# Patient Record
Sex: Male | Born: 1970 | Race: Black or African American | Hispanic: No | Marital: Single | Smoking: Never smoker
Health system: Southern US, Community
[De-identification: ages and names within clinical notes are randomized; demographics above are authoritative.]

## PROBLEM LIST (undated history)

## (undated) DIAGNOSIS — I10 Essential (primary) hypertension: Secondary | ICD-10-CM

## (undated) DIAGNOSIS — E119 Type 2 diabetes mellitus without complications: Secondary | ICD-10-CM

## (undated) HISTORY — PX: CHOLECYSTECTOMY: SHX55

## (undated) HISTORY — PX: HERNIA REPAIR: SHX51

---

## 2005-02-18 ENCOUNTER — Emergency Department: Payer: Self-pay

## 2007-07-07 ENCOUNTER — Encounter: Payer: Self-pay | Admitting: Orthopedic Surgery

## 2007-08-03 ENCOUNTER — Emergency Department (HOSPITAL_COMMUNITY): Admission: EM | Admit: 2007-08-03 | Discharge: 2007-08-04 | Payer: Self-pay | Admitting: Emergency Medicine

## 2007-08-05 ENCOUNTER — Ambulatory Visit: Payer: Self-pay | Admitting: Orthopedic Surgery

## 2007-08-05 DIAGNOSIS — IMO0002 Reserved for concepts with insufficient information to code with codable children: Secondary | ICD-10-CM

## 2007-08-06 ENCOUNTER — Encounter: Payer: Self-pay | Admitting: Orthopedic Surgery

## 2007-09-02 ENCOUNTER — Telehealth: Payer: Self-pay | Admitting: Orthopedic Surgery

## 2007-10-08 ENCOUNTER — Observation Stay (HOSPITAL_COMMUNITY): Admission: EM | Admit: 2007-10-08 | Discharge: 2007-10-10 | Payer: Self-pay | Admitting: Emergency Medicine

## 2007-10-08 ENCOUNTER — Ambulatory Visit: Payer: Self-pay | Admitting: Cardiology

## 2007-10-09 ENCOUNTER — Encounter: Payer: Self-pay | Admitting: Cardiology

## 2007-10-09 ENCOUNTER — Encounter (INDEPENDENT_AMBULATORY_CARE_PROVIDER_SITE_OTHER): Payer: Self-pay | Admitting: Family Medicine

## 2008-04-25 ENCOUNTER — Emergency Department (HOSPITAL_COMMUNITY): Admission: EM | Admit: 2008-04-25 | Discharge: 2008-04-25 | Payer: Self-pay | Admitting: Emergency Medicine

## 2009-04-23 ENCOUNTER — Emergency Department (HOSPITAL_COMMUNITY): Admission: EM | Admit: 2009-04-23 | Discharge: 2009-04-23 | Payer: Self-pay | Admitting: Emergency Medicine

## 2009-08-03 IMAGING — CR DG HAND COMPLETE 3+V*L*
3 series · 3 of 3 positions shown · non-contrast
Comparison: None.

CLINICAL DATA: Hand injury.  Pain in the fourth and fifth fingers.

LEFT HAND - COMPLETE 3+ VIEW

[view not recorded (1 of 3)]
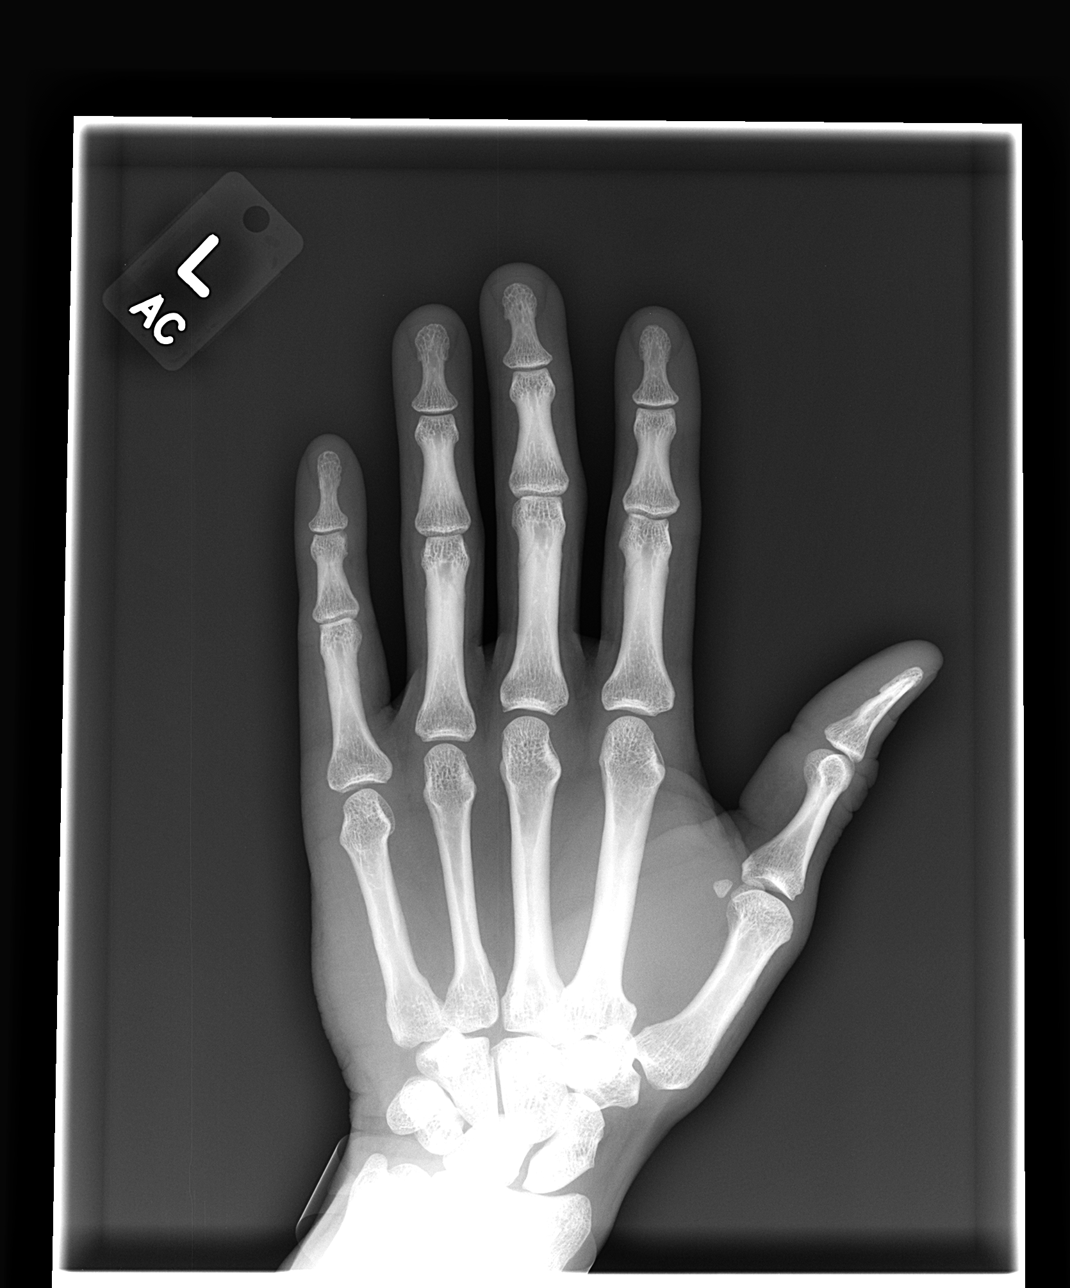

[view not recorded (2 of 3)]
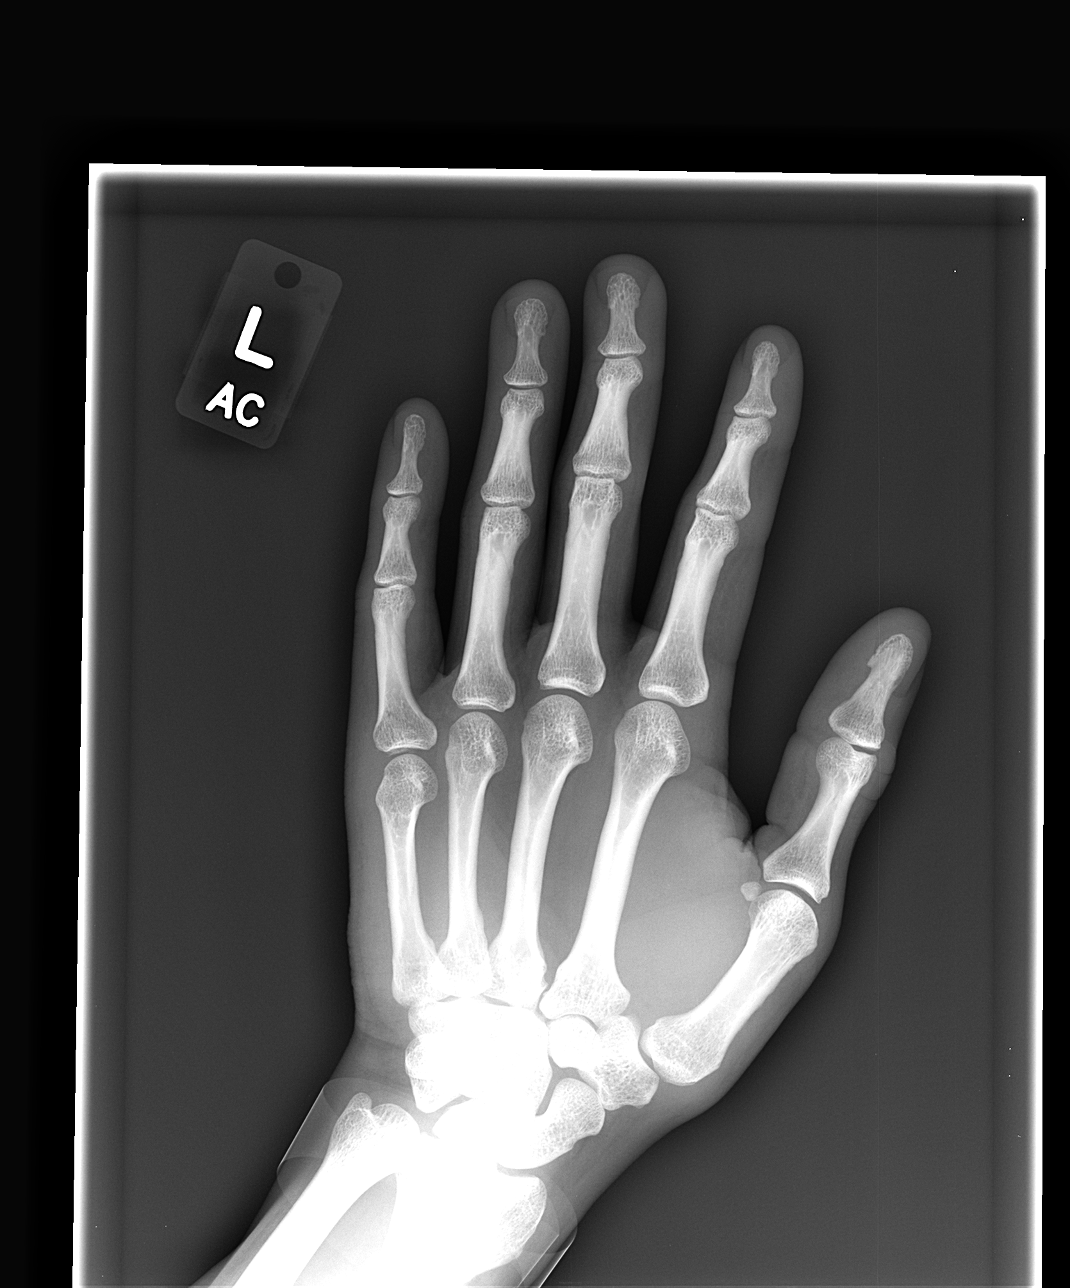

[view not recorded (3 of 3)]
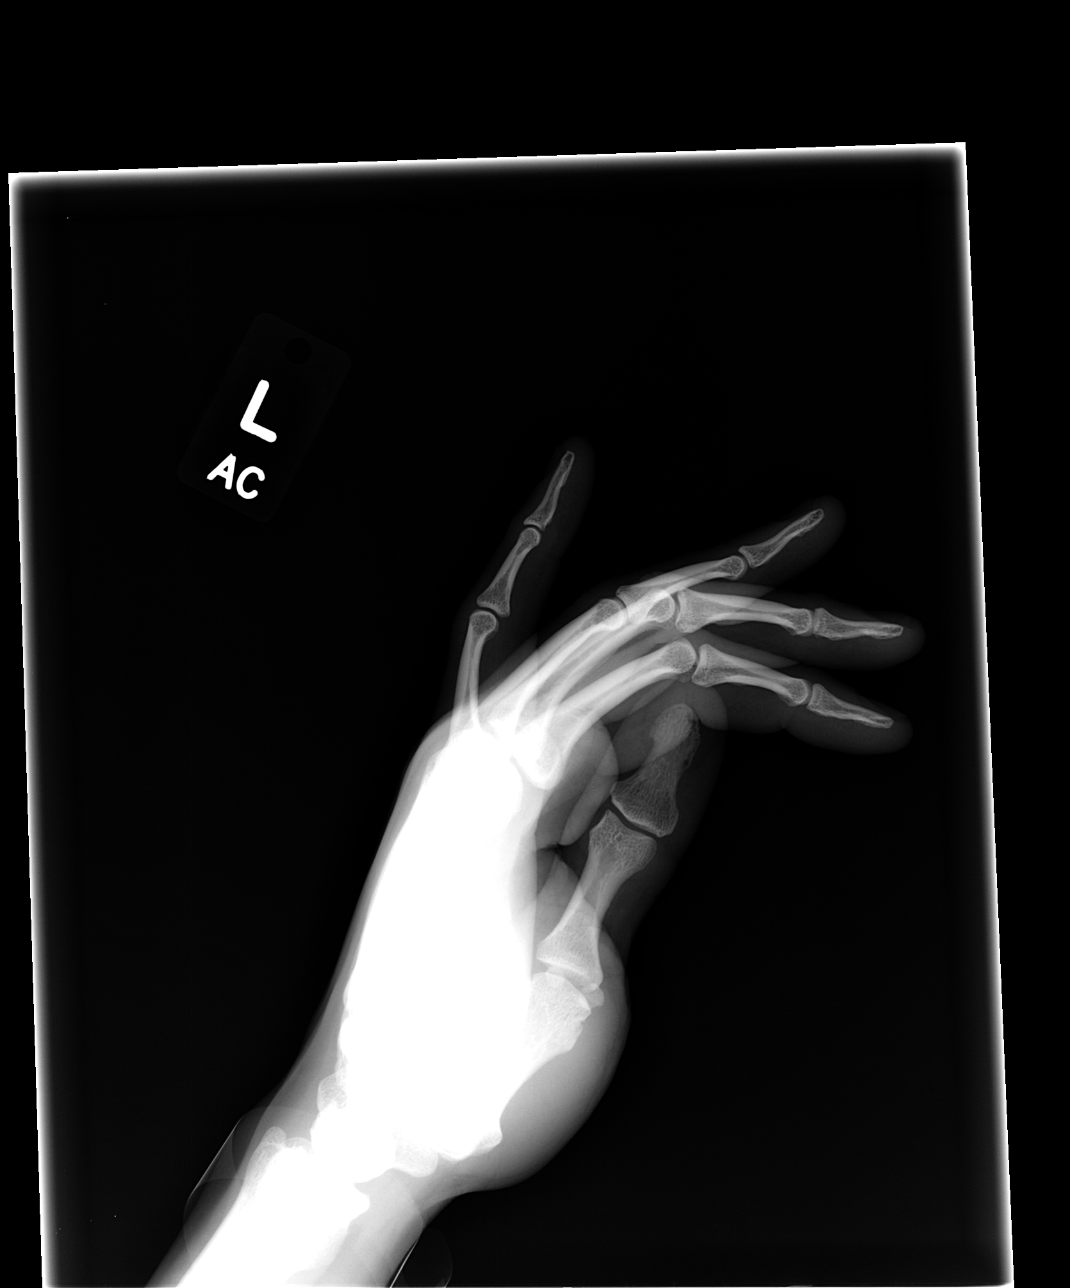

[3 of 3 positions shown; findings below may reference images not displayed]

FINDINGS: There is slight irregularity of the volar base of the
fifth proximal phalanx with an associated oblique lucency on the [facility]
oblique views.  This area is not well visualized on the lateral
view, but is suspicious for an intra-articular fracture.  There is
no dislocation.  No other fractures or foreign bodies are seen.
IMPRESSION: Suspicion of nondisplaced intra-articular fracture of the base of
the fifth proximal phalanx as described.

## 2009-10-28 ENCOUNTER — Emergency Department (HOSPITAL_COMMUNITY): Admission: EM | Admit: 2009-10-28 | Discharge: 2009-10-28 | Payer: Self-pay | Admitting: Emergency Medicine

## 2010-04-18 ENCOUNTER — Emergency Department (HOSPITAL_COMMUNITY)
Admission: EM | Admit: 2010-04-18 | Discharge: 2010-04-19 | Disposition: A | Discharge status: Home / Self Care | Payer: Medicaid Other | Attending: Emergency Medicine | Admitting: Emergency Medicine

## 2010-04-18 DIAGNOSIS — J020 Streptococcal pharyngitis: Secondary | ICD-10-CM | POA: Insufficient documentation

## 2010-06-20 LAB — POCT I-STAT, CHEM 8
BUN: 12 mg/dL (ref 6–23)
Calcium, Ion: 1.21 mmol/L (ref 1.12–1.32)
Creatinine, Ser: 1 mg/dL (ref 0.4–1.5)
Glucose, Bld: 371 mg/dL — ABNORMAL HIGH (ref 70–99)
Hemoglobin: 18.7 g/dL — ABNORMAL HIGH (ref 13.0–17.0)
TCO2: 28 mmol/L (ref 0–100)

## 2010-06-20 LAB — POCT CARDIAC MARKERS: CKMB, poc: <1 ng/mL — ABNORMAL LOW (ref 1.0–8.0)
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
## 2010-07-18 NOTE — Consult Note (Signed)
NAME:  Jordan Osborn, Jordan Osborn                    ACCOUNT NO.:  [id number]   MEDICAL RECORD NO.:  [id number]          PATIENT TYPE:  INP   LOCATION:  A336                          FACILITY:  [facility]   PHYSICIAN:  Thomas C. Wall, MD, FACCDATE OF BIRTH:  Feb 07, 1971   DATE OF CONSULTATION:  10/09/2007  DATE OF DISCHARGE:                                 CONSULTATION   I was asked by Dr. Elige Radon of the [facility] to consult on Jordan  Osborn with chest pain.   HISTORY OF PRESENT ILLNESS:  He is a 40 year old African-American male  who was at [facility] yesterday complaining of chronic  headaches.  While he was there, he began to have some aching in his left  arm and then he had some sharp pain in his left side of his chest.  It  went to his right side.   He had no nausea, vomiting, or diaphoresis.   He does note some sweats at night.  He has had no fever that he is aware  of.  He has had no chills.   He denies any recent viral illness such as URI.   His EKG is normal except for perhaps some mild early repolarization.  His point of care markers enzymes were negative thus far.  Chest x-ray  showed no acute cardiopulmonary disease.   He had a head CT for his headaches, which is negative.   PAST MEDICAL HISTORY:   ALLERGIES:  There is no known drug allergies.   He was on no medications prior to admission.   He has had no previous surgery.   SOCIAL HISTORY:  He currently lives with his girlfriend in [place].  He is a nonsmoker and nondrinker.   His risk factors for coronary artery disease are really negative.  He  does not know his lipid panel.   REVIEW OF SYSTEMS:  As in the HPI is really negative.  All points of  care reviewed.  Also refer to Dr. Claudean Severance evaluation.   EXAM:  GENERAL:  He is in no acute distress.  He is a pleasant  gentleman.  SKIN:  Warm and dry.  VITAL SIGNS:  His blood pressure is 129/77, his pulse is 80 and regular,  his respiratory rate is 20,  temperature is 98.6, and room air sats 93%.  HEENT:  Somewhat of a male balding pattern.  PERRLA.  Extraocular  movement is intact.  Sclerae clear.  Facial symmetry is normal.  Dentition is satisfactory.  Carotid upstrokes were equal bilaterally without bruits.  There is no  obvious lymphadenopathy.  There is no thyromegaly.  Trachea is midline.  LUNGS:  Clear to auscultation.  No rub.  HEART:  Reveals a soft S1 and S2.  No rub, no gallop, no murmur.  PMI is  hard to appreciate.  He is fairly muscular.  ABDOMINAL EXAM:  Soft.  Good bowel sounds.  No tenderness.  No midline  bruits.  No obvious organomegaly.  EXTREMITIES:  Without cyanosis, clubbing, or edema.  Pulses are intact.  NEURO EXAM:  Intact.   ASSESSMENT:  Atypical noncoronary chest pain, rule out possible  pericarditis by history only.  His exam and EKG do not support that  diagnosis.   RECOMMENDATIONS:  Check echo.  If negative or no pericardial effusion,  reassurance only.   Thank you for the consultation.      Thomas C. Daleen Squibb, MD, [facility]  Electronically Signed     TCW/MEDQ  D:  10/09/2007  T:  10/09/2007  Job:  [id number]

## 2010-12-01 LAB — DIFFERENTIAL
Eosinophils Absolute: 0
Lymphocytes Relative: 14
Lymphocytes Relative: 25
Lymphs Abs: 1.4
Lymphs Abs: 1.9
Monocytes Absolute: 0.7
Neutro Abs: 8.2 — ABNORMAL HIGH
Neutrophils Relative %: 62
Neutrophils Relative %: 78 — ABNORMAL HIGH

## 2010-12-01 LAB — PROTIME-INR
INR: 1
Prothrombin Time: 13.4

## 2010-12-01 LAB — BASIC METABOLIC PANEL
BUN: 12
BUN: 16
CO2: 28
Chloride: 102
Creatinine, Ser: 1.06
Creatinine, Ser: 1.22
GFR calc Af Amer: >60
GFR calc non Af Amer: >60
GFR calc non Af Amer: >60
Glucose, Bld: 196 — ABNORMAL HIGH
Potassium: 4.3
Sodium: 141
Sodium: 141

## 2010-12-01 LAB — CBC
HCT: 43.5
HCT: 48
Hemoglobin: 14.7
MCV: 88.5
Platelets: 225
Platelets: 252
Platelets: 262
RDW: 13.5
WBC: 10.6 — ABNORMAL HIGH
WBC: 7.7

## 2010-12-01 LAB — CARDIAC PANEL(CRET KIN+CKTOT+MB+TROPI)
CK, MB: 0.9
Relative Index: 0.7
Relative Index: 0.8
Relative Index: 0.8
Troponin I: 0.01
Troponin I: 0.01

## 2010-12-01 LAB — POCT CARDIAC MARKERS
CKMB, poc: 1.7
Myoglobin, poc: 108
Troponin i, poc: <0.05

## 2010-12-01 LAB — LIPID PANEL
Cholesterol: 179
HDL: 31 — ABNORMAL LOW
Total CHOL/HDL Ratio: 5.8
Triglycerides: 108

## 2011-12-09 ENCOUNTER — Emergency Department (HOSPITAL_COMMUNITY): Payer: Medicare Other

## 2011-12-09 ENCOUNTER — Observation Stay (HOSPITAL_COMMUNITY)
Admission: EM | Admit: 2011-12-09 | Discharge: 2011-12-11 | Disposition: A | Discharge status: Home / Self Care | Payer: Medicare Other | Attending: General Surgery | Admitting: General Surgery

## 2011-12-09 ENCOUNTER — Encounter (HOSPITAL_COMMUNITY): Payer: Self-pay | Admitting: *Deleted

## 2011-12-09 DIAGNOSIS — R112 Nausea with vomiting, unspecified: Secondary | ICD-10-CM | POA: Insufficient documentation

## 2011-12-09 DIAGNOSIS — R109 Unspecified abdominal pain: Secondary | ICD-10-CM | POA: Insufficient documentation

## 2011-12-09 DIAGNOSIS — K5289 Other specified noninfective gastroenteritis and colitis: Principal | ICD-10-CM | POA: Insufficient documentation

## 2011-12-09 DIAGNOSIS — R197 Diarrhea, unspecified: Secondary | ICD-10-CM | POA: Insufficient documentation

## 2011-12-09 LAB — URINALYSIS, ROUTINE W REFLEX MICROSCOPIC
Bilirubin Urine: NEGATIVE
Hgb urine dipstick: NEGATIVE
Ketones, ur: NEGATIVE mg/dL
Protein, ur: NEGATIVE mg/dL
Urobilinogen, UA: 0.2 mg/dL (ref 0.0–1.0)

## 2011-12-09 LAB — COMPREHENSIVE METABOLIC PANEL
AST: 24 U/L (ref 0–37)
Alkaline Phosphatase: 116 U/L (ref 39–117)
BUN: 11 mg/dL (ref 6–23)
CO2: 25 mEq/L (ref 19–32)
Chloride: 99 mEq/L (ref 96–112)
Creatinine, Ser: 1.13 mg/dL (ref 0.50–1.35)
GFR calc non Af Amer: 78 mL/min — ABNORMAL LOW (ref >90)
Total Bilirubin: 0.2 mg/dL — ABNORMAL LOW (ref 0.3–1.2)

## 2011-12-09 LAB — CBC
HCT: 47.3 % (ref 39.0–52.0)
MCV: 86.8 fL (ref 78.0–100.0)
Platelets: 225 10*3/uL (ref 150–400)
RBC: 5.45 MIL/uL (ref 4.22–5.81)
WBC: 12.4 10*3/uL — ABNORMAL HIGH (ref 4.0–10.5)

## 2011-12-09 LAB — LIPASE, BLOOD: Lipase: 72 U/L — ABNORMAL HIGH (ref 11–59)

## 2011-12-09 MED ORDER — ONDANSETRON HCL 4 MG/2ML IJ SOLN
4.0000 mg | Freq: Four times a day (QID) | INTRAMUSCULAR | Status: DC | PRN
  Administered 2011-12-10: 4 mg via INTRAVENOUS
  Filled 2011-12-09: qty 2

## 2011-12-09 MED ORDER — SODIUM CHLORIDE 0.9 % IV BOLUS (SEPSIS)
1000.0000 mL | Freq: Once | INTRAVENOUS | Status: AC
  Administered 2011-12-09: 1000 mL via INTRAVENOUS

## 2011-12-09 MED ORDER — PANTOPRAZOLE SODIUM 40 MG IV SOLR
40.0000 mg | Freq: Every day | INTRAVENOUS | Status: DC
  Administered 2011-12-10 (×2): 40 mg via INTRAVENOUS
  Filled 2011-12-09 (×2): qty 40

## 2011-12-09 MED ORDER — HYDROMORPHONE HCL PF 1 MG/ML IJ SOLN
1.0000 mg | INTRAMUSCULAR | Status: DC | PRN
  Administered 2011-12-10 – 2011-12-11 (×2): 1 mg via INTRAVENOUS
  Filled 2011-12-09 (×2): qty 1

## 2011-12-09 MED ORDER — METHYLPREDNISOLONE SODIUM SUCC 125 MG IJ SOLR
125.0000 mg | Freq: Once | INTRAMUSCULAR | Status: AC
  Administered 2011-12-09: 125 mg via INTRAVENOUS
  Filled 2011-12-09: qty 2

## 2011-12-09 MED ORDER — HYDROCODONE-ACETAMINOPHEN 5-325 MG PO TABS
1.0000 | ORAL_TABLET | ORAL | Status: DC | PRN
Start: 2011-12-09 — End: 2011-12-11
  Administered 2011-12-10 – 2011-12-11 (×3): 2 via ORAL
  Filled 2011-12-09 (×3): qty 2

## 2011-12-09 MED ORDER — IOHEXOL 300 MG/ML  SOLN
100.0000 mL | Freq: Once | INTRAMUSCULAR | Status: AC | PRN
  Administered 2011-12-09: 100 mL via INTRAVENOUS

## 2011-12-09 MED ORDER — ONDANSETRON HCL 4 MG/2ML IJ SOLN
4.0000 mg | Freq: Once | INTRAMUSCULAR | Status: AC
  Administered 2011-12-09: 4 mg via INTRAVENOUS
  Filled 2011-12-09: qty 2

## 2011-12-09 MED ORDER — ACETAMINOPHEN 650 MG RE SUPP: 650.0000 mg | Freq: Four times a day (QID) | RECTAL | Status: DC | PRN

## 2011-12-09 MED ORDER — FAMOTIDINE IN NACL 20-0.9 MG/50ML-% IV SOLN
20.0000 mg | Freq: Once | INTRAVENOUS | Status: AC
  Administered 2011-12-09: 20 mg via INTRAVENOUS
  Filled 2011-12-09: qty 50

## 2011-12-09 MED ORDER — ENOXAPARIN SODIUM 40 MG/0.4ML ~~LOC~~ SOLN
40.0000 mg | SUBCUTANEOUS | Status: DC
  Administered 2011-12-10 – 2011-12-11 (×2): 40 mg via SUBCUTANEOUS
  Filled 2011-12-09 (×2): qty 0.4

## 2011-12-09 MED ORDER — CIPROFLOXACIN IN D5W 400 MG/200ML IV SOLN
400.0000 mg | Freq: Two times a day (BID) | INTRAVENOUS | Status: DC
  Administered 2011-12-10 (×3): 400 mg via INTRAVENOUS
  Filled 2011-12-09 (×6): qty 200

## 2011-12-09 MED ORDER — KCL IN DEXTROSE-NACL 20-5-0.45 MEQ/L-%-% IV SOLN
INTRAVENOUS | Status: DC
  Administered 2011-12-10 (×3): via INTRAVENOUS

## 2011-12-09 MED ORDER — ACETAMINOPHEN 325 MG PO TABS: 650.0000 mg | ORAL_TABLET | Freq: Four times a day (QID) | ORAL | Status: DC | PRN

## 2011-12-09 MED ORDER — HYDROXYZINE HCL 50 MG/ML IM SOLN
25.0000 mg | Freq: Once | INTRAMUSCULAR | Status: AC
  Administered 2011-12-09: 25 mg via INTRAMUSCULAR
  Filled 2011-12-09: qty 1

## 2011-12-09 MED ORDER — MORPHINE SULFATE 4 MG/ML IJ SOLN
4.0000 mg | Freq: Once | INTRAMUSCULAR | Status: AC
  Administered 2011-12-09: 4 mg via INTRAVENOUS
  Filled 2011-12-09: qty 1

## 2011-12-09 NOTE — ED Provider Notes (Signed)
History     CSN: [id number]  Arrival date & time 12/09/11  1850   First MD Initiated Contact with Patient 12/09/11 1858      Chief Complaint  Patient presents with  . Abdominal Pain    (Consider location/radiation/quality/duration/timing/severity/associated sxs/prior treatment) Patient is a 41 y.o. male presenting with abdominal pain. The history is provided by the patient.  Abdominal Pain The primary symptoms of the illness include abdominal pain. The primary symptoms of the illness do not include fever, shortness of breath, vomiting or dysuria.  Symptoms associated with the illness do not include constipation or back pain.  pt states onset nausea, abdominal pain, w few episodes diarrhea through course of day. Diarrhea watery, no bloody or mucousy. Decreased appetite today. States did eat fish at [facility] yesterday, but others who ate not sick. States now w constant lower abdominal pain. No specific exacerbating or alleviating factors. Dull, worse w palpation. No radiation. No back or flank pain. No scrotal or testicular pain. No gu c/o. No fever or chills. Denies hx same symptoms. No known ill contacts.     History reviewed. No pertinent past medical history.  History reviewed. No pertinent past surgical history.  No family history on file.  History  Substance Use Topics  . Smoking status: Never Smoker   . Smokeless tobacco: Not on file  . Alcohol Use: Yes     occasionally      Review of Systems  Constitutional: Negative for fever.  HENT: Negative for neck pain.   Eyes: Negative for redness.  Respiratory: Negative for cough and shortness of breath.   Cardiovascular: Negative for chest pain.  Gastrointestinal: Positive for abdominal pain. Negative for vomiting and constipation.  Genitourinary: Negative for dysuria and flank pain.  Musculoskeletal: Negative for back pain.  Skin: Negative for rash.  Neurological: Negative for headaches.  Hematological: Does not  bruise/bleed easily.  Psychiatric/Behavioral: Negative for confusion.    Allergies  Benadryl  Home Medications  No current outpatient prescriptions on file.  BP 157/93  Pulse 86  Temp 98.3 F (36.8 C) (Oral)  Resp 18  Ht 5\' 11"  (1.803 m)  Wt 200 lb (90.719 kg)  BMI 27.89 kg/m2  SpO2 100%  Physical Exam  Nursing note and vitals reviewed. Constitutional: He is oriented to person, place, and time. He appears well-developed and well-nourished. No distress.  HENT:  Mouth/Throat: Oropharynx is clear and moist.  Eyes: Pupils are equal, round, and reactive to light.  Neck: Neck supple. No tracheal deviation present.  Cardiovascular: Normal rate, regular rhythm, normal heart sounds and intact distal pulses.   Pulmonary/Chest: Effort normal and breath sounds normal. No accessory muscle usage. No respiratory distress.  Abdominal: Soft. Bowel sounds are normal. He exhibits no distension and no mass. There is tenderness. There is no rebound and no guarding.       Lower abdominal tenderness, esp left.   Genitourinary:       No cva tenderness. No testicular/scrotal pain, swelling or tenderness.   Musculoskeletal: Normal range of motion. He exhibits no edema and no tenderness.  Neurological: He is alert and oriented to person, place, and time.  Skin: Skin is warm and dry.  Psychiatric: He has a normal mood and affect.    ED Course  Procedures (including critical care time)   Labs Reviewed  CBC  COMPREHENSIVE METABOLIC PANEL  URINALYSIS, ROUTINE W REFLEX MICROSCOPIC  LIPASE, BLOOD   Results for orders placed during the hospital encounter of 12/09/11  CBC      Component Value Range   WBC 12.4 (*) 4.0 - 10.5 K/uL   RBC 5.45  4.22 - 5.81 MIL/uL   Hemoglobin 16.7  13.0 - 17.0 g/dL   HCT 16.1  09.6 - 04.5 %   MCV 86.8  78.0 - 100.0 fL   MCH 30.6  26.0 - 34.0 pg   MCHC 35.3  30.0 - 36.0 g/dL   RDW 40.9  81.1 - 91.4 %   Platelets 225  150 - 400 K/uL  COMPREHENSIVE METABOLIC PANEL       Component Value Range   Sodium 135  135 - 145 mEq/L   Potassium 3.9  3.5 - 5.1 mEq/L   Chloride 99  96 - 112 mEq/L   CO2 25  19 - 32 mEq/L   Glucose, Bld 203 (*) 70 - 99 mg/dL   BUN 11  6 - 23 mg/dL   Creatinine, Ser 7.82  0.50 - 1.35 mg/dL   Calcium 95.6  8.4 - 21.3 mg/dL   Total Protein 8.2  6.0 - 8.3 g/dL   Albumin 4.4  3.5 - 5.2 g/dL   AST 24  0 - 37 U/L   ALT 23  0 - 53 U/L   Alkaline Phosphatase 116  39 - 117 U/L   Total Bilirubin 0.2 (*) 0.3 - 1.2 mg/dL   GFR calc non Af Amer 78 (*) >90 mL/min   GFR calc Af Amer >90  >90 mL/min  URINALYSIS, ROUTINE W REFLEX MICROSCOPIC      Component Value Range   Color, Urine YELLOW  YELLOW   APPearance CLEAR  CLEAR   Specific Gravity, Urine >1.030 (*) 1.005 - 1.030   pH 5.0  5.0 - 8.0   Glucose, UA NEGATIVE  NEGATIVE mg/dL   Hgb urine dipstick NEGATIVE  NEGATIVE   Bilirubin Urine NEGATIVE  NEGATIVE   Ketones, ur NEGATIVE  NEGATIVE mg/dL   Protein, ur NEGATIVE  NEGATIVE mg/dL   Urobilinogen, UA 0.2  0.0 - 1.0 mg/dL   Nitrite NEGATIVE  NEGATIVE   Leukocytes, UA NEGATIVE  NEGATIVE  LIPASE, BLOOD      Component Value Range   Lipase 72 (*) 11 - 59 U/L      MDM  Iv ns. Morphine iv. zofran iv. Labs.   Recheck persistent nausea. zofran iv.  Additional iv ns bolus.  Recheck persistent lower abd tenderness/pain (note no epigastric pain/tenderness, although lipase mildly elev).  Ct pending.   ?rxn to contrast, notes mild itching to ankles/trunk. No throat swelling. No wheezing/sob. No faintness. Pt also notes rxn to benadryl -  States itching?rash. No wheezing. Pharynx normal. ?tiny single hive/urticarial lesion to trunk. No gen rash/hives.   pepcid iv. Solumedrol iv. Vistaril.  Recheck pt improved. No rash, no sob.   Discussed labs w pt, appears glucose elev in past, pt states has been told that in past but has never taken meds for same.  Ct results noted, discussed w radiologist as no prior abd surgery, no hernias, no  apparent reason to suspect sbo, and that symptoms more c/w gastroenteritis. States ct findings could represent other process however still concern for sbo.  Recheck pt, appears more comfortable that prior. No bilious emesis, no recurrent emesis in ed, nausea improved. abd sl distended, no peritonitis on exam, no persistent/focal tenderness.   Given ct reading/findings, discussed pt with gen surgery on call, Dr Lovell Sheehan, he will see patient in ed.  Signed out to Dr Manus Gunning, ct  findings, and that surgery is coming to see in ed, anticipate admit to gen surg. If recurrent nausea/vomiting, ng.            Suzi Roots, MD 12/09/11 2206

## 2011-12-09 NOTE — ED Notes (Signed)
Patient states he does not need anything and he is feeling better at this time.

## 2011-12-09 NOTE — ED Notes (Signed)
Pt states he ate at a restaurant yesterday and has been feeling sick since.

## 2011-12-09 NOTE — ED Notes (Signed)
Patient states his stomach is starting to really burn again. RN aware.

## 2011-12-09 NOTE — H&P (Signed)
Jordan Osborn is an 41 y.o. male.   Chief Complaint: Abdominal pain, nausea, and diarrhea HPI: Patient is a 41 year old black male in his usual state of health who ate at [facility] yesterday evening. Soon after he ate fish, he began experiencing significant diarrhea. He also had some nausea. This has continued to this evening where he presented emergency room with ongoing abdominal pain, cramping, diarrhea. He did not have emesis until he drank the oral contrast for the CT scan. CT scan interestingly showed possible bowel obstruction with question pneumatosis. On further review, the radiologist stated that this finding could be seen with enteritis. The patient never has had surgery.   History reviewed. No pertinent past medical history.  History reviewed. No pertinent past surgical history.  No family history on file. Social History:  reports that he has never smoked. He does not have any smokeless tobacco history on file. He reports that he drinks alcohol. He reports that he does not use illicit drugs.  Allergies:  Allergies  Allergen Reactions  . Benadryl (Diphenhydramine Hcl)      (Not in a hospital admission)  Results for orders placed during the hospital encounter of 12/09/11 (from the past 48 hour(s))  CBC     Status: Abnormal   Collection Time   12/09/11  7:11 PM      Component Value Range Comment   WBC 12.4 (*) 4.0 - 10.5 K/uL    RBC 5.45  4.22 - 5.81 MIL/uL    Hemoglobin 16.7  13.0 - 17.0 g/dL    HCT 16.1  09.6 - 04.5 %    MCV 86.8  78.0 - 100.0 fL    MCH 30.6  26.0 - 34.0 pg    MCHC 35.3  30.0 - 36.0 g/dL    RDW 40.9  81.1 - 91.4 %    Platelets 225  150 - 400 K/uL   COMPREHENSIVE METABOLIC PANEL     Status: Abnormal   Collection Time   12/09/11  7:11 PM      Component Value Range Comment   Sodium 135  135 - 145 mEq/L    Potassium 3.9  3.5 - 5.1 mEq/L    Chloride 99  96 - 112 mEq/L    CO2 25  19 - 32 mEq/L    Glucose, Bld 203 (*) 70 - 99 mg/dL    BUN 11  6 -  23 mg/dL    Creatinine, Ser 7.82  0.50 - 1.35 mg/dL    Calcium 95.6  8.4 - 10.5 mg/dL    Total Protein 8.2  6.0 - 8.3 g/dL    Albumin 4.4  3.5 - 5.2 g/dL    AST 24  0 - 37 U/L    ALT 23  0 - 53 U/L    Alkaline Phosphatase 116  39 - 117 U/L    Total Bilirubin 0.2 (*) 0.3 - 1.2 mg/dL    GFR calc non Af Amer 78 (*) >90 mL/min    GFR calc Af Amer >90  >90 mL/min   LIPASE, BLOOD     Status: Abnormal   Collection Time   12/09/11  7:11 PM      Component Value Range Comment   Lipase 72 (*) 11 - 59 U/L   URINALYSIS, ROUTINE W REFLEX MICROSCOPIC     Status: Abnormal   Collection Time   12/09/11  7:55 PM      Component Value Range Comment   Color, Urine YELLOW  YELLOW    APPearance CLEAR  CLEAR    Specific Gravity, Urine >1.030 (*) 1.005 - 1.030    pH 5.0  5.0 - 8.0    Glucose, UA NEGATIVE  NEGATIVE mg/dL    Hgb urine dipstick NEGATIVE  NEGATIVE    Bilirubin Urine NEGATIVE  NEGATIVE    Ketones, ur NEGATIVE  NEGATIVE mg/dL    Protein, ur NEGATIVE  NEGATIVE mg/dL    Urobilinogen, UA 0.2  0.0 - 1.0 mg/dL    Nitrite NEGATIVE  NEGATIVE    Leukocytes, UA NEGATIVE  NEGATIVE MICROSCOPIC NOT DONE ON URINES WITH NEGATIVE PROTEIN, BLOOD, LEUKOCYTES, NITRITE, OR GLUCOSE <1000 mg/dL.   Ct Abdomen Pelvis W Contrast  12/09/2011  *RADIOLOGY REPORT*  Clinical Data: Abdominal pain, nausea, weakness  CT ABDOMEN AND PELVIS WITH CONTRAST  Technique:  Multidetector CT imaging of the abdomen and pelvis was performed following the standard protocol during bolus administration of intravenous contrast.  Contrast: OMNIPAQUE IOHEXOL 300 MG/ML  SOLN  Comparison: None.  Findings:  There is marked gaseous and fluid distension of the small bowel with transition point identified within the left lower abdominal quadrant (axial image 69, series 2, coronal image 55, series four). This is associated with marked decompression of the distal small bowel, findings compatible with high-grade small bowel obstruction, the etiology  of which is not detected on this examination.   There is several foci of nondependent air within dilated loops of bowel within the midline of the lower abdomen which is concerning for pneumatosis.  No definite pneumoperitoneum or portal venous air. Small amount of free fluid in the pelvis.   Normal appearance of the appendix.  Normal hepatic contour.  No discrete hepatic lesions.  The gallbladder is under distended but otherwise normal.  No definite intra or extrahepatic biliary ductal dilatation.  No ascites.  There is symmetric enhancement and excretion of the bilateral kidneys.  No urinary obstruction.  No definite renal stones on the post contrast examination. No renal lesions.  No perinephric stranding.  Normal appearance of the bilateral adrenal glands, pancreas and spleen.  Limited visualization of the lower thorax demonstrates subsegmental basilar atelectasis.  No focal airspace opacities.  No pleural effusion or pneumothorax.  Normal heart size.  No pericardial effusion.  No acute or aggressive osseous abnormalities.  IMPRESSION: 1.  High-grade obstruction small bowel obstruction with transition point within the left lower abdominal quadrant.  The etiology of this obstruction is not depicted on this examination and thus presumably secondary to adhesions. 2.  Possible pneumatosis within several dilated loops of small bowel within the midline of the lower abdomen.  No definite pneumoperitoneum or portal venous gas.  Above findings discussed with Dr. Manus Gunning at [phone].   Original Report Authenticated By: Waynard Reeds, M.D.     Review of Systems  Constitutional: Positive for malaise/fatigue.  HENT: Negative.   Eyes: Negative.   Respiratory: Negative.   Cardiovascular: Negative.   Gastrointestinal: Positive for nausea, abdominal pain and diarrhea.  Genitourinary: Negative.   Musculoskeletal: Negative.   Skin: Negative.   Neurological: Negative.   Endo/Heme/Allergies: Negative.     Psychiatric/Behavioral: Negative.     Blood pressure 157/93, pulse 86, temperature 98.3 F (36.8 C), temperature source Oral, resp. rate 18, height 5\' 11"  (1.803 m), weight 90.719 kg (200 lb), SpO2 100.00%. Physical Exam  Constitutional: He is oriented to person, place, and time. He appears well-developed and well-nourished.  HENT:  Head: Normocephalic and atraumatic.  Neck: Normal range  of motion. Neck supple.  Cardiovascular: Normal rate, regular rhythm and normal heart sounds.   Respiratory: Effort normal and breath sounds normal.  GI: Soft. Bowel sounds are normal. He exhibits distension and mass. There is tenderness. There is no rebound and no guarding.  Neurological: He is alert and oriented to person, place, and time.  Skin: Skin is warm and dry.  Psychiatric: He has a normal mood and affect. His behavior is normal. Judgment and thought content normal.     Assessment/Plan Impression: Diarrhea and abdominal pain, more consistent with enteritis than a small bowel obstruction. He does not look sick enough to warrant acute surgical intervention. We'll admit him to the hospital for IV hydration and IV ciprofloxacin. He will be continued on a clear liquid diet. Further management is pending his course.  He understands and agrees to the treatment plan.  Ugochukwu Chichester A 12/09/2011, 10:22 PM

## 2011-12-09 NOTE — ED Notes (Signed)
Patient states he wants the room cooler at this time.

## 2011-12-09 NOTE — ED Notes (Signed)
Patient back from ct and is broke out in hives. states he is allergic to ivp dye. This was not charted on his allergy list

## 2011-12-10 ENCOUNTER — Encounter (HOSPITAL_COMMUNITY): Payer: Self-pay | Admitting: *Deleted

## 2011-12-10 LAB — COMPREHENSIVE METABOLIC PANEL
ALT: 17 U/L (ref 0–53)
AST: 19 U/L (ref 0–37)
Albumin: 3.4 g/dL — ABNORMAL LOW (ref 3.5–5.2)
Alkaline Phosphatase: 90 U/L (ref 39–117)
Calcium: 8.6 mg/dL (ref 8.4–10.5)
Glucose, Bld: 273 mg/dL — ABNORMAL HIGH (ref 70–99)
Potassium: 4.2 mEq/L (ref 3.5–5.1)
Sodium: 135 mEq/L (ref 135–145)
Total Protein: 6.6 g/dL (ref 6.0–8.3)

## 2011-12-10 LAB — CBC
HCT: 42.9 % (ref 39.0–52.0)
Hemoglobin: 14.8 g/dL (ref 13.0–17.0)
MCH: 30.1 pg (ref 26.0–34.0)
MCV: 87.2 fL (ref 78.0–100.0)
Platelets: 220 10*3/uL (ref 150–400)
RBC: 4.92 MIL/uL (ref 4.22–5.81)
WBC: 10.9 10*3/uL — ABNORMAL HIGH (ref 4.0–10.5)

## 2011-12-10 MED ORDER — CIPROFLOXACIN IN D5W 400 MG/200ML IV SOLN
INTRAVENOUS | Status: AC
  Filled 2011-12-10: qty 200

## 2011-12-10 MED ORDER — INFLUENZA VIRUS VACC SPLIT PF IM SUSP
0.5000 mL | INTRAMUSCULAR | Status: AC
  Administered 2011-12-11: 0.5 mL via INTRAMUSCULAR
  Filled 2011-12-10: qty 0.5

## 2011-12-10 MED ORDER — INFLUENZA VIRUS VACC SPLIT PF IM SUSP: 0.5000 mL | INTRAMUSCULAR | Status: DC

## 2011-12-10 NOTE — Progress Notes (Signed)
Inpatient Diabetes Program Recommendations  AACE/ADA: New Consensus Statement on Inpatient Glycemic Control  Target Ranges:  Prepandial:   less than 140 mg/dL      Peak postprandial:   less than 180 mg/dL (1-2 hours)      Critically ill patients:  140 - 180 mg/dL  Pager:  [phone] Hours:  8 am-10pm   Reason for Visit: Elevated lab glucose:  273 mg/dl and no history of Diabetes noted  Inpatient Diabetes Program Recommendations HgbA1C: Check HgbA1C to assess glycemic control and initiate Glycemic Control Order Set  Jordan Client PhD, RN, BC-ADM Diabetes Coordinator  Office:  [phone] Team Pager:  [phone]

## 2011-12-10 NOTE — Progress Notes (Signed)
Subjective: Better. His diarrhea seems to be slowing. Tolerating clear liquid diet well.  Objective: Vital signs in last 24 hours: Temp:  [97.5 F (36.4 C)-98.3 F (36.8 C)] 97.9 F (36.6 C) (10/07 0816) Pulse Rate:  [67-86] 70  (10/07 0400) Resp:  [18-19] 18  (10/07 0400) BP: (129-157)/(72-93) 129/72 mmHg (10/07 0400) SpO2:  [97 %-100 %] 97 % (10/07 0400) Weight:  [90.719 kg (200 lb)] 90.719 kg (200 lb) (10/06 2335) Last BM Date: 12/09/11  Intake/Output from previous day: 10/06 0701 - 10/07 0700 In: 1620 [P.O.:720; I.V.:700; IV Piggyback:200] Out: 301 [Urine:300; Stool:1] Intake/Output this shift: Total I/O In: 700 [I.V.:500; IV Piggyback:200] Out: -   General appearance: alert, cooperative and no distress Resp: clear to auscultation bilaterally Cardio: regular rate and rhythm, S1, S2 normal, no murmur, click, rub or gallop GI: Soft, no rigidity noted. Less distention noted.  Lab Results:   Basename 12/10/11 0426 12/09/11 1911  WBC 10.9* 12.4*  HGB 14.8 16.7  HCT 42.9 47.3  PLT 220 225   BMET  Basename 12/10/11 0426 12/09/11 1911  NA 135 135  K 4.2 3.9  CL 103 99  CO2 21 25  GLUCOSE 273* 203*  BUN 9 11  CREATININE 0.97 1.13  CALCIUM 8.6 10.5   PT/INR No results found for this basename: LABPROT:2,INR:2 in the last 72 hours  Studies/Results: Ct Abdomen Pelvis W Contrast  12/09/2011  *RADIOLOGY REPORT*  Clinical Data: Abdominal pain, nausea, weakness  CT ABDOMEN AND PELVIS WITH CONTRAST  Technique:  Multidetector CT imaging of the abdomen and pelvis was performed following the standard protocol during bolus administration of intravenous contrast.  Contrast: OMNIPAQUE IOHEXOL 300 MG/ML  SOLN  Comparison: None.  Findings:  There is marked gaseous and fluid distension of the small bowel with transition point identified within the left lower abdominal quadrant (axial image 69, series 2, coronal image 55, series four). This is associated with marked  decompression of the distal small bowel, findings compatible with high-grade small bowel obstruction, the etiology of which is not detected on this examination.   There is several foci of nondependent air within dilated loops of bowel within the midline of the lower abdomen which is concerning for pneumatosis.  No definite pneumoperitoneum or portal venous air. Small amount of free fluid in the pelvis.   Normal appearance of the appendix.  Normal hepatic contour.  No discrete hepatic lesions.  The gallbladder is under distended but otherwise normal.  No definite intra or extrahepatic biliary ductal dilatation.  No ascites.  There is symmetric enhancement and excretion of the bilateral kidneys.  No urinary obstruction.  No definite renal stones on the post contrast examination. No renal lesions.  No perinephric stranding.  Normal appearance of the bilateral adrenal glands, pancreas and spleen.  Limited visualization of the lower thorax demonstrates subsegmental basilar atelectasis.  No focal airspace opacities.  No pleural effusion or pneumothorax.  Normal heart size.  No pericardial effusion.  No acute or aggressive osseous abnormalities.  IMPRESSION: 1.  High-grade obstruction small bowel obstruction with transition point within the left lower abdominal quadrant.  The etiology of this obstruction is not depicted on this examination and thus presumably secondary to adhesions. 2.  Possible pneumatosis within several dilated loops of small bowel within the midline of the lower abdomen.  No definite pneumoperitoneum or portal venous gas.  Above findings discussed with Dr. Manus Gunning at [phone].   Original Report Authenticated By: Judene Companion.D.  Anti-infectives: Anti-infectives     Start     Dose/Rate Route Frequency Ordered Stop   12/10/11 0015   ciprofloxacin (CIPRO) IVPB 400 mg        400 mg 200 mL/hr over 60 Minutes Intravenous Every 12 hours 12/09/11 2354            Assessment/Plan: Impression:  Gastroenteritis with diarrhea, slowly resolving. Stool cultures are pending. Leukocytosis is resolving. This seems less likely to be a small bowel obstruction. Will advance to soft diet. Anticipate discharge in the next 24-48 hours.  LOS: 1 day    Miken Stecher A 12/10/2011

## 2011-12-11 MED ORDER — CIPROFLOXACIN HCL 500 MG PO TABS: 500.0000 mg | ORAL_TABLET | Freq: Two times a day (BID) | ORAL | Status: DC

## 2011-12-11 NOTE — Progress Notes (Signed)
Patient given discharge instructions. Patient verbalized understanding of discharge instructions. Patient alert, oriented and in stable condition at the time of discharge. Patient waiting in room for daughter to arrive.

## 2011-12-11 NOTE — Progress Notes (Signed)
UR Chart Review Completed  

## 2011-12-11 NOTE — Discharge Summary (Signed)
Physician Discharge Summary  Patient ID: Jordan Osborn MRN: [id number] DOB/AGE: 09-11-1970 41 y.o.  Admit date: 12/09/2011 Discharge date: 12/11/2011  Admission Diagnoses: Gastroenteritis  Discharge Diagnoses: Same Active Problems:  * No active hospital problems. *    Discharged Condition: good  Hospital Course: Patient is a 41 year old black male who presented the emergency room with a 24-hour history of worsening nausea, vomiting, diarrhea. In the emergency room, he was found to have a possible small bowel traction on CT scan the abdomen. In talking with the patient, he had eaten at a local restaurants seafood and immediately began experiencing abdominal pain and diarrhea. As noted the hospital for control of his symptoms. Cultures of stool ordered but his diarrhea did clear up. He was started on ciprofloxacin. His diet was advanced at difficulty. He is being discharged home in good improving condition on ciprofloxacin.  Treatments: antibiotics: Cipro  Discharge Exam: Blood pressure 152/90, pulse 78, temperature 98.6 F (37 C), temperature source Oral, resp. rate 18, height 5\' 11"  (1.803 m), weight 90.719 kg (200 lb), SpO2 98.00%. General appearance: alert and cooperative Resp: clear to auscultation bilaterally Cardio: regular rate and rhythm, S1, S2 normal, no murmur, click, rub or gallop GI: soft, non-tender; bowel sounds normal; no masses,  no organomegaly  Disposition: 01-Home or Self Care     Medication List     As of 12/11/2011 10:29 AM    TAKE these medications         ciprofloxacin 500 MG tablet   Commonly known as: CIPRO   Take 1 tablet (500 mg total) by mouth 2 (two) times daily.           Follow-up Information    Follow up with Dalia Heading, MD. (As needed)    Contact information:   [address] [place] [address] [phone]          Signed: Franky Macho A 12/11/2011, 10:29 AM

## 2013-12-09 IMAGING — CT CT ABD-PELV W/ CM
2 of 4 series · 15 of 46 positions shown, 17 images · IV contrast (Omnipaque 300)
Comparison: None.

CLINICAL DATA: Abdominal pain, nausea, weakness

CT ABDOMEN AND PELVIS WITH CONTRAST
TECHNIQUE: Multidetector CT imaging of the abdomen and pelvis was
performed following the standard protocol during bolus
administration of intravenous contrast.
Contrast: 100mL OMNIPAQUE IOHEXOL 300 MG/ML  SOLN

[Series 2: abd_pel_with 5.0 b40f · axial · 0.70mm/px · z∈[-506,-61]mm · 12 of 101 slices shown, 14 images]
[im 6/101  soft-tissue]
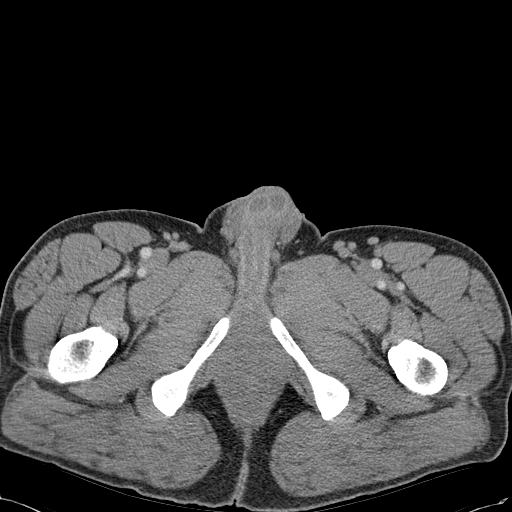
[im 6/101  bone]
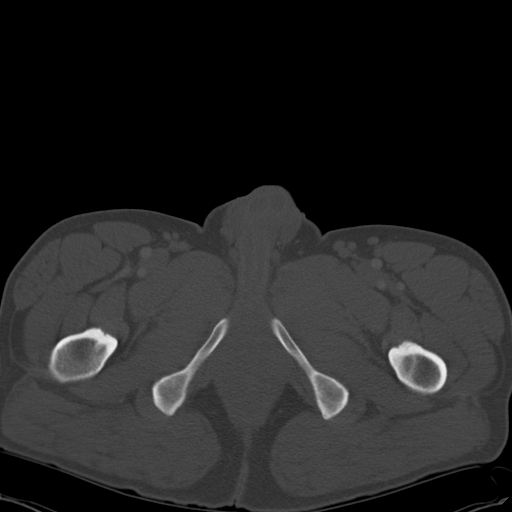
[im 16/101  soft-tissue]
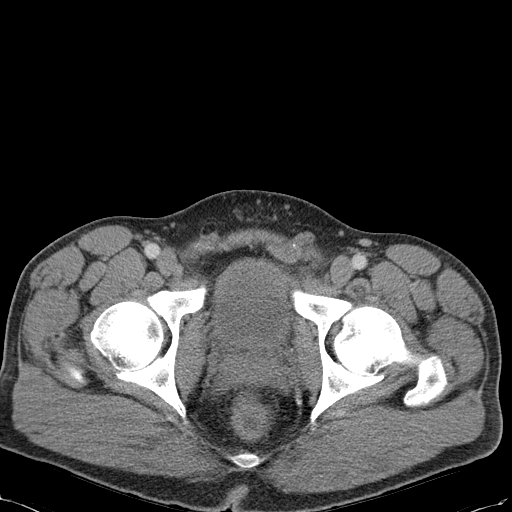
[im 22/101  soft-tissue]
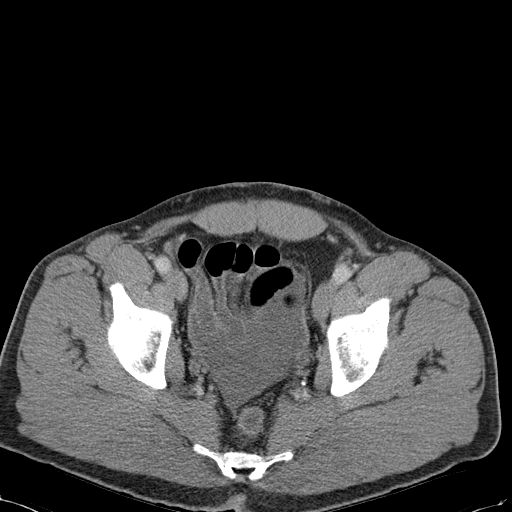
[im 32/101  soft-tissue]
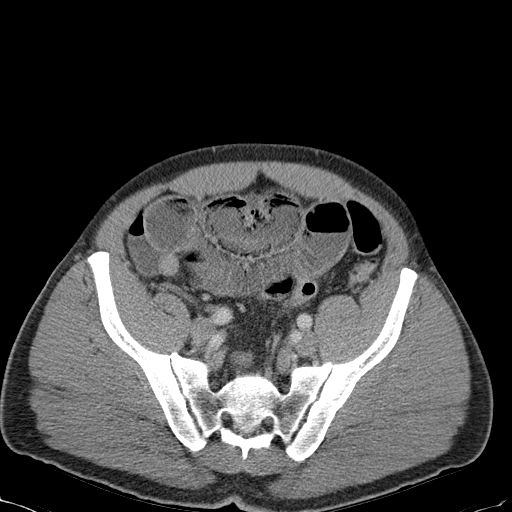
[im 37/101  soft-tissue]
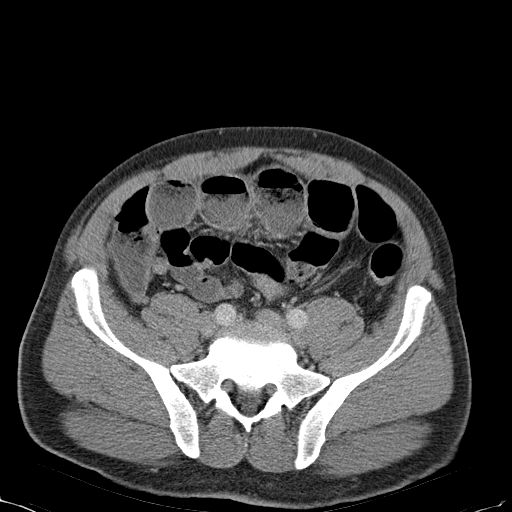
[im 48/101  soft-tissue]
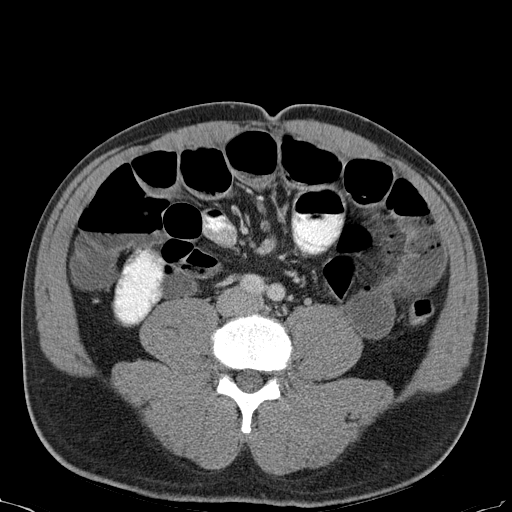
[im 53/101  soft-tissue]
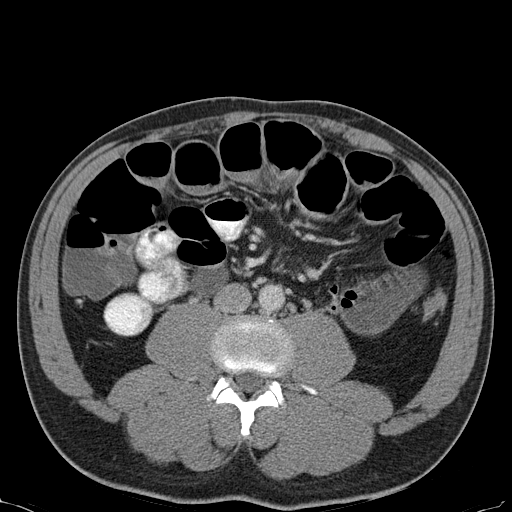
[im 64/101  soft-tissue]
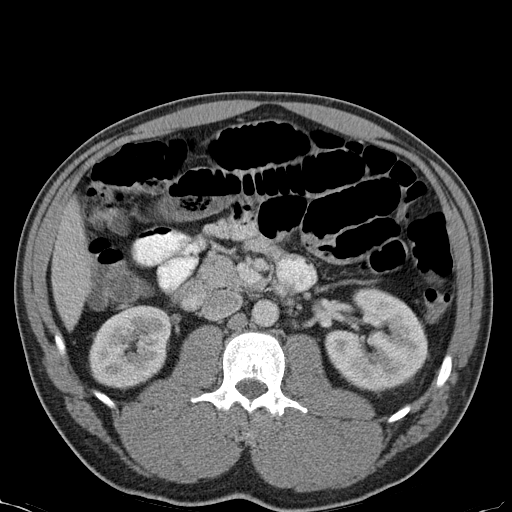
[im 69/101  soft-tissue]
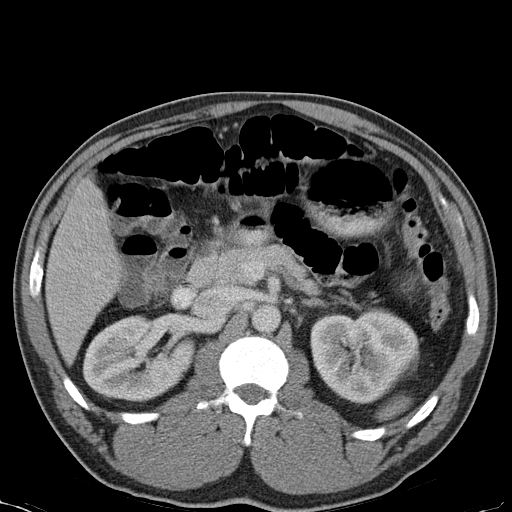
[im 69/101  bone]
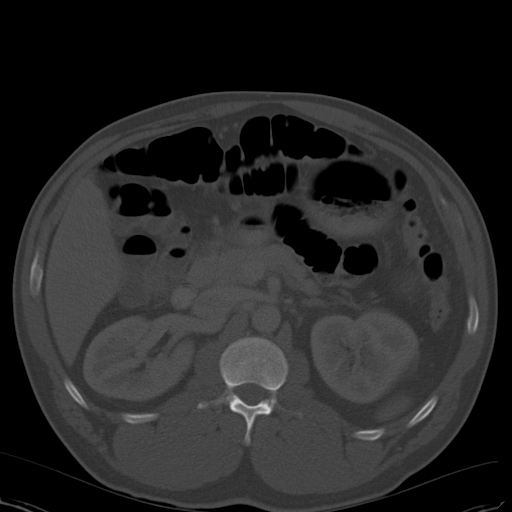
[im 79/101  soft-tissue]
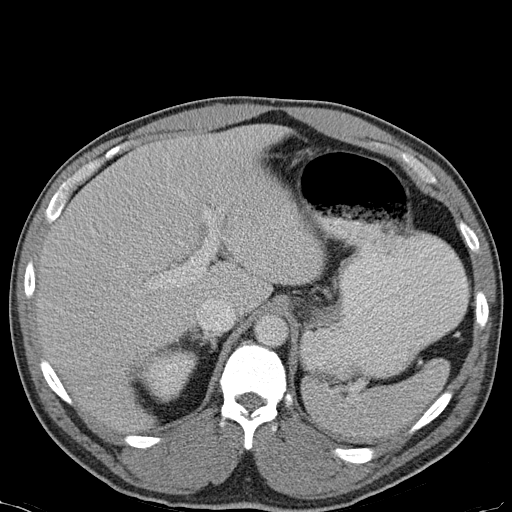
[im 85/101  soft-tissue]
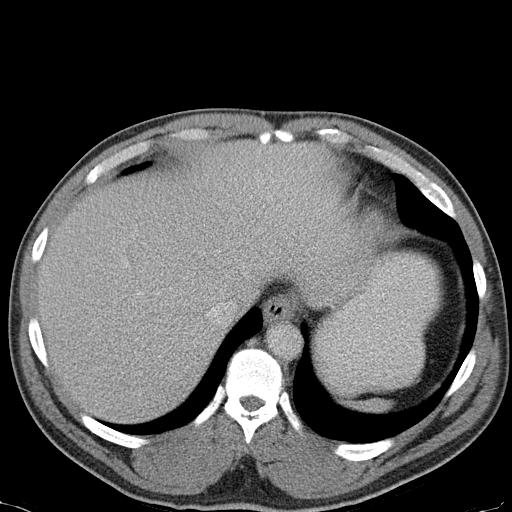
[im 95/101  soft-tissue]
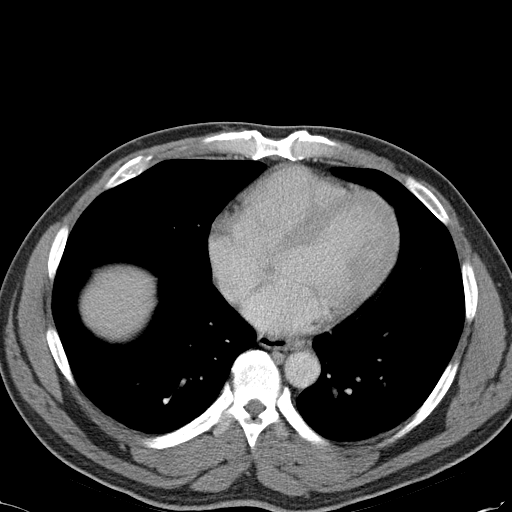

[Series 4: abd_pel_with 3.0 spo cor · coronal · 0.71mm/px · 3 of 107 slices shown]
[im 36/107  soft-tissue]
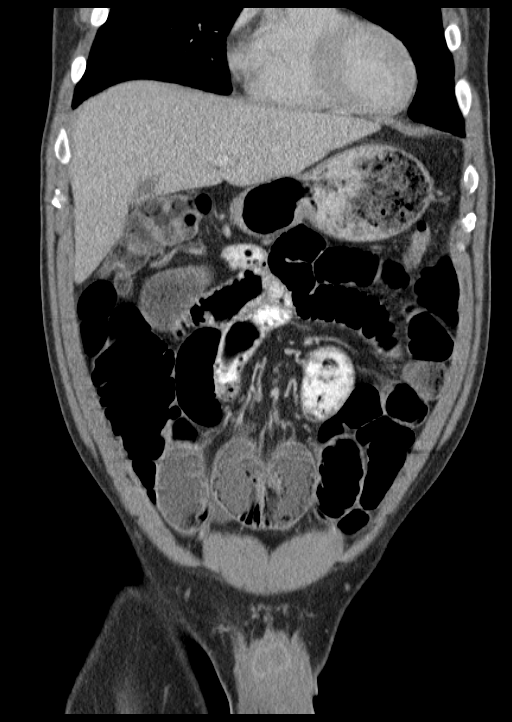
[im 48/107  soft-tissue]
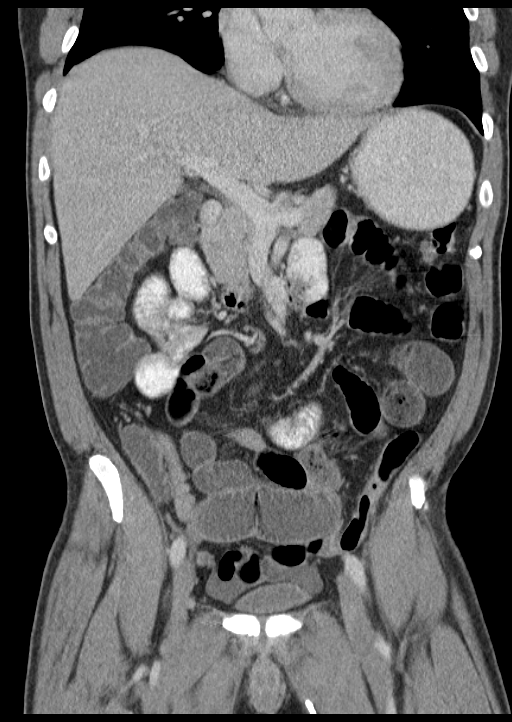
[im 59/107  soft-tissue]
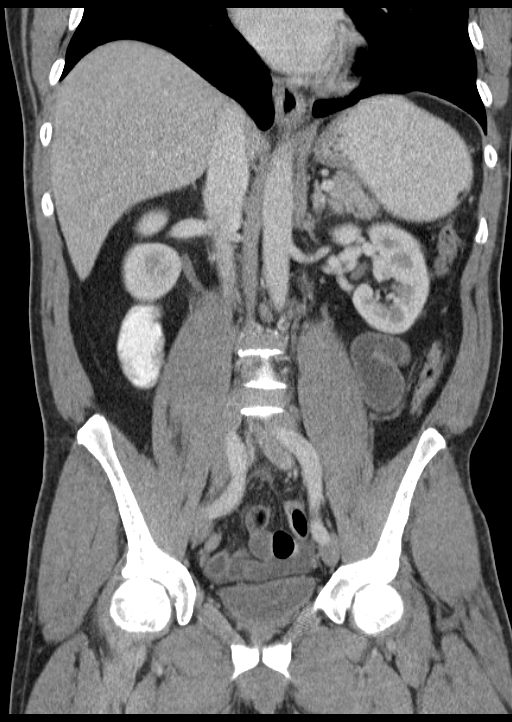

[15 of 46 positions shown; findings below may reference images not displayed]

FINDINGS: There is marked gaseous and fluid distension of the small bowel
with transition point identified within the left lower abdominal
quadrant (axial image 69, series 2, coronal image 55, series four).
This is associated with marked decompression of the distal small
bowel, findings compatible with high-grade small bowel obstruction,
the etiology of which is not detected on this examination.   There
is several foci of nondependent air within dilated loops of bowel
within the midline of the lower abdomen which is concerning for
pneumatosis.  No definite pneumoperitoneum or portal venous air.
Small amount of free fluid in the pelvis.   Normal appearance of
the appendix.

Normal hepatic contour.  No discrete hepatic lesions.  The
gallbladder is under distended but otherwise normal.  No definite
intra or extrahepatic biliary ductal dilatation.  No ascites.

There is symmetric enhancement and excretion of the bilateral
kidneys.  No urinary obstruction.  No definite renal stones on the
post contrast examination. No renal lesions.  No perinephric
stranding.  Normal appearance of the bilateral adrenal glands,
pancreas and spleen.

Limited visualization of the lower thorax demonstrates subsegmental
basilar atelectasis.  No focal airspace opacities.  No pleural
effusion or pneumothorax.  Normal heart size.  No pericardial
effusion.

No acute or aggressive osseous abnormalities.
IMPRESSION: 1.  High-grade obstruction small bowel obstruction with transition
point within the left lower abdominal quadrant.  The etiology of
this obstruction is not depicted on this examination and thus
presumably secondary to adhesions.
2.  Possible pneumatosis within several dilated loops of small
bowel within the midline of the lower abdomen.  No definite
pneumoperitoneum or portal venous gas.

Above findings discussed with Dr. Lorraine at [id number].

## 2019-10-08 ENCOUNTER — Encounter (HOSPITAL_COMMUNITY): Payer: Self-pay | Admitting: *Deleted

## 2019-10-08 ENCOUNTER — Other Ambulatory Visit: Payer: Self-pay

## 2019-10-08 DIAGNOSIS — R112 Nausea with vomiting, unspecified: Secondary | ICD-10-CM | POA: Insufficient documentation

## 2019-10-08 DIAGNOSIS — R197 Diarrhea, unspecified: Secondary | ICD-10-CM | POA: Diagnosis not present

## 2019-10-08 DIAGNOSIS — R1084 Generalized abdominal pain: Secondary | ICD-10-CM | POA: Diagnosis not present

## 2019-10-08 DIAGNOSIS — I10 Essential (primary) hypertension: Secondary | ICD-10-CM | POA: Diagnosis not present

## 2019-10-08 DIAGNOSIS — G8929 Other chronic pain: Secondary | ICD-10-CM | POA: Diagnosis not present

## 2019-10-08 DIAGNOSIS — E1165 Type 2 diabetes mellitus with hyperglycemia: Secondary | ICD-10-CM | POA: Insufficient documentation

## 2019-10-08 LAB — CBC
HCT: 45.5 % (ref 39.0–52.0)
Hemoglobin: 15.4 g/dL (ref 13.0–17.0)
MCH: 30 pg (ref 26.0–34.0)
MCHC: 33.8 g/dL (ref 30.0–36.0)
MCV: 88.7 fL (ref 80.0–100.0)
Platelets: 191 10*3/uL (ref 150–400)
RBC: 5.13 MIL/uL (ref 4.22–5.81)
RDW: 12.3 % (ref 11.5–15.5)
WBC: 10.2 10*3/uL (ref 4.0–10.5)
nRBC: 0 % (ref 0.0–0.2)

## 2019-10-08 LAB — COMPREHENSIVE METABOLIC PANEL
ALT: 66 U/L — ABNORMAL HIGH (ref 0–44)
AST: 129 U/L — ABNORMAL HIGH (ref 15–41)
Albumin: 4.3 g/dL (ref 3.5–5.0)
Alkaline Phosphatase: 155 U/L — ABNORMAL HIGH (ref 38–126)
Anion gap: 12 (ref 5–15)
BUN: 16 mg/dL (ref 6–20)
CO2: 24 mmol/L (ref 22–32)
Calcium: 9.8 mg/dL (ref 8.9–10.3)
Chloride: 98 mmol/L (ref 98–111)
Creatinine, Ser: 1.01 mg/dL (ref 0.61–1.24)
GFR calc Af Amer: >60 mL/min (ref >60)
GFR calc non Af Amer: >60 mL/min (ref >60)
Glucose, Bld: 354 mg/dL — ABNORMAL HIGH (ref 70–99)
Potassium: 4.1 mmol/L (ref 3.5–5.1)
Sodium: 134 mmol/L — ABNORMAL LOW (ref 135–145)
Total Bilirubin: 0.8 mg/dL (ref 0.3–1.2)
Total Protein: 7.9 g/dL (ref 6.5–8.1)

## 2019-10-08 LAB — LIPASE, BLOOD: Lipase: 51 U/L (ref 11–51)

## 2019-10-08 NOTE — ED Triage Notes (Signed)
Pt c/o abd pain with n/v/d that has been constant since having gallbladder surgery 2016. Pt was seen at [facility] on 10/06/2019 for same, states that he has not gotten any better,

## 2019-10-09 ENCOUNTER — Emergency Department (HOSPITAL_COMMUNITY)
Admission: EM | Admit: 2019-10-09 | Discharge: 2019-10-09 | Disposition: A | Discharge status: Home / Self Care | Payer: Medicare Other | Attending: Emergency Medicine | Admitting: Emergency Medicine

## 2019-10-09 ENCOUNTER — Emergency Department (HOSPITAL_COMMUNITY): Payer: Medicare Other

## 2019-10-09 DIAGNOSIS — R1084 Generalized abdominal pain: Secondary | ICD-10-CM

## 2019-10-09 DIAGNOSIS — R739 Hyperglycemia, unspecified: Secondary | ICD-10-CM

## 2019-10-09 HISTORY — DX: Essential (primary) hypertension: I10

## 2019-10-09 HISTORY — DX: Type 2 diabetes mellitus without complications: E11.9

## 2019-10-09 LAB — URINALYSIS, ROUTINE W REFLEX MICROSCOPIC
Bacteria, UA: NONE SEEN
Bilirubin Urine: NEGATIVE
Glucose, UA: >=500 mg/dL — AB
Ketones, ur: 5 mg/dL — AB
Leukocytes,Ua: NEGATIVE
Nitrite: NEGATIVE
Protein, ur: 30 mg/dL — AB
Specific Gravity, Urine: 1.031 — ABNORMAL HIGH (ref 1.005–1.030)
pH: 5 (ref 5.0–8.0)

## 2019-10-09 LAB — ACETAMINOPHEN LEVEL: Acetaminophen (Tylenol), Serum: <10 ug/mL — ABNORMAL LOW (ref 10–30)

## 2019-10-09 LAB — CBG MONITORING, ED
Glucose-Capillary: 340 mg/dL — ABNORMAL HIGH (ref 70–99)
Glucose-Capillary: 280 mg/dL — ABNORMAL HIGH (ref 70–99)

## 2019-10-09 LAB — HEPATITIS PANEL, ACUTE
HCV Ab: NONREACTIVE
Hep A IgM: NONREACTIVE
Hep B C IgM: NONREACTIVE
Hepatitis B Surface Ag: NONREACTIVE

## 2019-10-09 LAB — TROPONIN I (HIGH SENSITIVITY)
Troponin I (High Sensitivity): 9 ng/L (ref <18)
Troponin I (High Sensitivity): 7 ng/L (ref <18)

## 2019-10-09 MED ORDER — METFORMIN HCL 500 MG PO TABS
500.0000 mg | ORAL_TABLET | Freq: Two times a day (BID) | ORAL | 0 refills | Status: DC
Start: 2019-10-09 — End: 2021-03-06

## 2019-10-09 MED ORDER — INSULIN ASPART 100 UNIT/ML ~~LOC~~ SOLN
10.0000 [IU] | Freq: Once | SUBCUTANEOUS | Status: AC
  Administered 2019-10-09: 10 [IU] via SUBCUTANEOUS
  Filled 2019-10-09: qty 1

## 2019-10-09 MED ORDER — ONDANSETRON HCL 4 MG/2ML IJ SOLN
4.0000 mg | Freq: Once | INTRAMUSCULAR | Status: AC
  Administered 2019-10-09: 4 mg via INTRAVENOUS
  Filled 2019-10-09: qty 2

## 2019-10-09 MED ORDER — FREESTYLE SYSTEM KIT: 1.0000 | PACK | 0 refills | Status: DC | PRN

## 2019-10-09 MED ORDER — SODIUM CHLORIDE 0.9 % IV BOLUS
1000.0000 mL | Freq: Once | INTRAVENOUS | Status: AC
  Administered 2019-10-09: 1000 mL via INTRAVENOUS

## 2019-10-09 NOTE — Discharge Instructions (Signed)
Take the Metformin as prescribed and monitor your blood sugar at least twice daily basis.  Establish care with a primary doctor to get restarted on her medications.  It is important to take medicine to control your blood pressure and diabetes.  Return to the ED if develop chest pain, shortness of breath, nausea, vomiting, other concerns.

## 2019-10-09 NOTE — ED Provider Notes (Signed)
[facility] EMERGENCY DEPARTMENT Provider Note   CSN: [id number] Arrival date & time: 10/08/19  2059     History Chief Complaint  Patient presents with  . Abdominal Pain    Jordan Osborn is a 49 y.o. male.  Patient arrives via EMS with ongoing abdominal pain and diarrhea that has been ongoing since his gallbladder surgery in 2016.  States he has diffuse abdominal cramping with multiple is is a loose stools on a daily basis.  He cannot quantify how many stools.  States his system has not adjusted since he had gallbladder surgery.  States stools have not been black or bloody.  Over the past several days has had some nausea and vomiting as well with diffuse abdominal pain worse in the upper abdomen.  Denies fever at home.  [facility] and states he was told to get his diabetes and blood pressure under control.  He has not been taking anything for his diabetes and blood pressure for several months.  States Metformin made him sick.  He has not followed up with the primary care doctor many years.  He is not having any chest pain or shortness of breath.  He denies any pain with urination or blood in the urine.  He states he has had severe pain in his stomach with nausea and vomiting today which led him to call EMS.  He was prescribed medications for his diabetes and hypertension 2 days ago [facility] but did not fill them and does not know what they are.  The history is provided by the patient.  Abdominal Pain Associated symptoms: diarrhea, nausea and vomiting   Associated symptoms: no chest pain, no cough, no dysuria, no fatigue, no fever, no hematuria and no shortness of breath        Past Medical History:  Diagnosis Date  . Diabetes mellitus without complication (HCC)   . Hypertension     Patient Active Problem List   Diagnosis Date Noted  . CLOS FRACTURE MID/PROXIMAL PHALANX/PHALANG HAND 08/05/2007    Past Surgical History:  Procedure Laterality Date  .  CHOLECYSTECTOMY    . HERNIA REPAIR         No family history on file.  Social History   Tobacco Use  . Smoking status: Never Smoker  . Smokeless tobacco: Never Used  Substance Use Topics  . Alcohol use: Yes    Comment: occasionally  . Drug use: No    Home Medications Prior to Admission medications   Medication Sig Start Date End Date Taking? Authorizing Provider  ciprofloxacin (CIPRO) 500 MG tablet Take 1 tablet (500 mg total) by mouth 2 (two) times daily. 12/11/11   Franky MachoJenkins, Mark, MD    Allergies    Benadryl [diphenhydramine hcl], Morphine and related, and Omnipaque [iohexol]  Review of Systems   Review of Systems  Constitutional: Positive for activity change and appetite change. Negative for fatigue and fever.  HENT: Negative for rhinorrhea.   Respiratory: Negative for cough, chest tightness and shortness of breath.   Cardiovascular: Negative for chest pain.  Gastrointestinal: Positive for abdominal pain, diarrhea, nausea and vomiting.  Genitourinary: Negative for dysuria, genital sores and hematuria.  Musculoskeletal: Negative for arthralgias and myalgias.  Skin: Negative for rash.  Neurological: Positive for weakness. Negative for dizziness and headaches.   all other systems are negative except as noted in the HPI and PMH. \   Physical Exam Updated Vital Signs BP (!) 152/82   Pulse Marland Kitchen(!)  109   Temp 99.1 F (37.3 C) (Oral)   Resp 18   Ht 6' (1.829 m)   Wt 89 kg   SpO2 97%   BMI 26.61 kg/m   Physical Exam Vitals and nursing note reviewed.  Constitutional:      General: He is not in acute distress.    Appearance: He is well-developed.  HENT:     Head: Normocephalic and atraumatic.     Mouth/Throat:     Pharynx: No oropharyngeal exudate.  Eyes:     Conjunctiva/sclera: Conjunctivae normal.     Pupils: Pupils are equal, round, and reactive to light.  Neck:     Comments: No meningismus. Cardiovascular:     Rate and Rhythm: Regular rhythm. Tachycardia  present.     Heart sounds: Normal heart sounds. No murmur heard.   Pulmonary:     Effort: Pulmonary effort is normal. No respiratory distress.     Breath sounds: Normal breath sounds.  Abdominal:     Palpations: Abdomen is soft.     Tenderness: There is abdominal tenderness. There is no guarding or rebound.     Comments: Diffuse tenderness, worse in epigastrium and periumbilically.  No guarding or rebound  Musculoskeletal:        General: No tenderness. Normal range of motion.     Cervical back: Normal range of motion and neck supple.     Comments: No CVA tenderness  Skin:    General: Skin is warm.  Neurological:     Mental Status: He is alert and oriented to person, place, and time.     Cranial Nerves: No cranial nerve deficit.     Motor: No abnormal muscle tone.     Coordination: Coordination normal.     Comments:  5/5 strength throughout. CN 2-12 intact.Equal grip strength.   Psychiatric:        Behavior: Behavior normal.     ED Results / Procedures / Treatments   Labs (all labs ordered are listed, but only abnormal results are displayed) Labs Reviewed  COMPREHENSIVE METABOLIC PANEL - Abnormal; Notable for the following components:      Result Value   Sodium 134 (*)    Glucose, Bld 354 (*)    AST 129 (*)    ALT 66 (*)    Alkaline Phosphatase 155 (*)    All other components within normal limits  URINALYSIS, ROUTINE W REFLEX MICROSCOPIC - Abnormal; Notable for the following components:   Specific Gravity, Urine 1.031 (*)    Glucose, UA >=500 (*)    Hgb urine dipstick SMALL (*)    Ketones, ur 5 (*)    Protein, ur 30 (*)    All other components within normal limits  ACETAMINOPHEN LEVEL - Abnormal; Notable for the following components:   Acetaminophen (Tylenol), Serum <10 (*)    All other components within normal limits  CBG MONITORING, ED - Abnormal; Notable for the following components:   Glucose-Capillary 340 (*)    All other components within normal limits  CBG  MONITORING, ED - Abnormal; Notable for the following components:   Glucose-Capillary 280 (*)    All other components within normal limits  LIPASE, BLOOD  CBC  HEPATITIS PANEL, ACUTE  TROPONIN I (HIGH SENSITIVITY)  TROPONIN I (HIGH SENSITIVITY)    EKG EKG Interpretation  Date/Time:  Friday October 09 2019 00:55:28 EDT Ventricular Rate:  90 PR Interval:    QRS Duration: 101 QT Interval:  371 QTC Calculation: 454 R Axis:  80 Text Interpretation: Sinus rhythm Left ventricular hypertrophy ST elev, probable normal early repol pattern similar to 2010 Confirmed by Glynn Octave [phone]) on 10/09/2019 1:22:42 AM   Radiology CT ABDOMEN PELVIS WO CONTRAST  Result Date: 10/09/2019 CLINICAL DATA:  Abdominal pain, nausea, vomiting, and diarrhea. Constant since gallbladder surgery in 2016. Contrast allergy. EXAM: CT ABDOMEN AND PELVIS WITHOUT CONTRAST TECHNIQUE: Multidetector CT imaging of the abdomen and pelvis was performed following the standard protocol without IV contrast. COMPARISON:  12/09/2011 FINDINGS: Lower chest: Lung bases are clear Hepatobiliary: No focal liver abnormality is seen. Status post cholecystectomy. No biliary dilatation. Pancreas: Unremarkable. No pancreatic ductal dilatation or surrounding inflammatory changes. Spleen: Normal in size without focal abnormality. Adrenals/Urinary Tract: Adrenal glands are unremarkable. Kidneys are normal, without renal calculi, focal lesion, or hydronephrosis. Bladder is unremarkable. Stomach/Bowel: Stomach is within normal limits. Appendix is surgically absent. No evidence of bowel wall thickening, distention, or inflammatory changes. Vascular/Lymphatic: Aortic atherosclerosis. No enlarged abdominal or pelvic lymph nodes. Reproductive: Prostate enlarged measuring 4.7 cm diameter Other: No abdominal wall hernia or abnormality. No abdominopelvic ascites. Musculoskeletal: No acute or significant osseous findings. IMPRESSION: 1. No acute process  demonstrated in the abdomen or pelvis. No evidence of bowel obstruction or inflammation. 2. Prior cholecystectomy. No bile duct dilatation or loculated collections. 3. Prostate enlarged. 4. Aortic atherosclerosis. Aortic Atherosclerosis (ICD10-I70.0). Electronically Signed   By: Burman Nieves M.D.   On: 10/09/2019 02:22    Procedures Procedures (including critical care time)  Medications Ordered in ED Medications  sodium chloride 0.9 % bolus 1,000 mL (has no administration in time range)  ondansetron (ZOFRAN) injection 4 mg (has no administration in time range)    ED Course  I have reviewed the triage vital signs and the nursing notes.  Pertinent labs & imaging results that were available during my care of the patient were reviewed by me and considered in my medical decision making (see chart for details).    MDM Rules/Calculators/A&P                         Chronic abdominal pain and diarrhea since having gallbladder removed 5 years ago.  Now with worsening pain and vomiting since last night.  Mildly tachycardic.  Abdomen is soft without peritoneal signs.  EKG shows ST elevation anteriorly similar to previous likely consistent with early repolarization and LVH. Reviewed with Dr. Charna Busman of cardiology who agrees. Similar to EKG in 2010.  Labs show nonspecific LFT elevation as well as hyperglycemia without DKA. Records obtained from [place].  He did have normal LFTs on August 3.  Lipase was 136.  Glucose was 290 with no anion gap.  CT today shows no acute pathology.  No bile duct dilation.  No vomiting or diarrhea throughout ED course.  Patient given IV fluids as well as subcutaneous insulin  Labs reassuring.  Hyperglycemia improved to 280.  Troponin negative x2.  Patient denies any chest pain.  EKG similar to previous as above.  Discussed importance of restarting medications for blood pressure and diabetes and establishing care with a primary doctor.  New prescription for  glucometer as well as restart Metformin.  Patient unreliable to follow-up.  Unclear origin of his mild acetaminophen elevation.  Acetaminophen levels negative.  Hepatitis panel in process.  Your PCP.  Return precautions discussed.  Final Clinical Impression(s) / ED Diagnoses Final diagnoses:  Generalized abdominal pain  Hyperglycemia    Rx / DC Orders ED Discharge Orders  None       Glynn Octave, MD 10/09/19 [phone]

## 2021-02-27 ENCOUNTER — Emergency Department
Admission: EM | Admit: 2021-02-27 | Discharge: 2021-02-28 | Disposition: A | Discharge status: Home / Self Care | Payer: Medicare Other | Attending: Emergency Medicine | Admitting: Emergency Medicine

## 2021-02-27 ENCOUNTER — Encounter: Payer: Self-pay | Admitting: Emergency Medicine

## 2021-02-27 DIAGNOSIS — Z7984 Long term (current) use of oral hypoglycemic drugs: Secondary | ICD-10-CM | POA: Diagnosis not present

## 2021-02-27 DIAGNOSIS — Z9049 Acquired absence of other specified parts of digestive tract: Secondary | ICD-10-CM | POA: Insufficient documentation

## 2021-02-27 DIAGNOSIS — R197 Diarrhea, unspecified: Secondary | ICD-10-CM

## 2021-02-27 DIAGNOSIS — A084 Viral intestinal infection, unspecified: Secondary | ICD-10-CM | POA: Insufficient documentation

## 2021-02-27 DIAGNOSIS — I10 Essential (primary) hypertension: Secondary | ICD-10-CM | POA: Insufficient documentation

## 2021-02-27 DIAGNOSIS — R109 Unspecified abdominal pain: Secondary | ICD-10-CM | POA: Insufficient documentation

## 2021-02-27 DIAGNOSIS — E119 Type 2 diabetes mellitus without complications: Secondary | ICD-10-CM | POA: Diagnosis not present

## 2021-02-27 DIAGNOSIS — Z20822 Contact with and (suspected) exposure to covid-19: Secondary | ICD-10-CM | POA: Diagnosis not present

## 2021-02-27 DIAGNOSIS — R000 Tachycardia, unspecified: Secondary | ICD-10-CM | POA: Diagnosis not present

## 2021-02-27 LAB — BASIC METABOLIC PANEL
Anion gap: 16 — ABNORMAL HIGH (ref 5–15)
BUN: 20 mg/dL (ref 6–20)
CO2: 17 mmol/L — ABNORMAL LOW (ref 22–32)
Calcium: 9.3 mg/dL (ref 8.9–10.3)
Chloride: 100 mmol/L (ref 98–111)
Creatinine, Ser: 1.54 mg/dL — ABNORMAL HIGH (ref 0.61–1.24)
GFR, Estimated: 54 mL/min — ABNORMAL LOW (ref >60)
Glucose, Bld: 232 mg/dL — ABNORMAL HIGH (ref 70–99)
Potassium: 4.4 mmol/L (ref 3.5–5.1)
Sodium: 133 mmol/L — ABNORMAL LOW (ref 135–145)

## 2021-02-27 LAB — CBC
HCT: 52.6 % — ABNORMAL HIGH (ref 39.0–52.0)
Hemoglobin: 18.1 g/dL — ABNORMAL HIGH (ref 13.0–17.0)
MCH: 29.8 pg (ref 26.0–34.0)
MCHC: 34.4 g/dL (ref 30.0–36.0)
MCV: 86.5 fL (ref 80.0–100.0)
Platelets: 250 10*3/uL (ref 150–400)
RBC: 6.08 MIL/uL — ABNORMAL HIGH (ref 4.22–5.81)
RDW: 12.9 % (ref 11.5–15.5)
WBC: 13.5 10*3/uL — ABNORMAL HIGH (ref 4.0–10.5)
nRBC: 0 % (ref 0.0–0.2)

## 2021-02-27 LAB — COMPREHENSIVE METABOLIC PANEL
ALT: 21 U/L (ref 0–44)
AST: 26 U/L (ref 15–41)
Albumin: 4.5 g/dL (ref 3.5–5.0)
Alkaline Phosphatase: 128 U/L — ABNORMAL HIGH (ref 38–126)
Anion gap: 20 — ABNORMAL HIGH (ref 5–15)
BUN: 21 mg/dL — ABNORMAL HIGH (ref 6–20)
CO2: 16 mmol/L — ABNORMAL LOW (ref 22–32)
Calcium: 9.7 mg/dL (ref 8.9–10.3)
Chloride: 96 mmol/L — ABNORMAL LOW (ref 98–111)
Creatinine, Ser: 1.67 mg/dL — ABNORMAL HIGH (ref 0.61–1.24)
GFR, Estimated: 49 mL/min — ABNORMAL LOW (ref >60)
Glucose, Bld: 241 mg/dL — ABNORMAL HIGH (ref 70–99)
Potassium: 4.6 mmol/L (ref 3.5–5.1)
Sodium: 132 mmol/L — ABNORMAL LOW (ref 135–145)
Total Bilirubin: 2 mg/dL — ABNORMAL HIGH (ref 0.3–1.2)
Total Protein: 8.7 g/dL — ABNORMAL HIGH (ref 6.5–8.1)

## 2021-02-27 LAB — LACTIC ACID, PLASMA: Lactic Acid, Venous: 2.4 mmol/L (ref 0.5–1.9)

## 2021-02-27 LAB — LIPASE, BLOOD: Lipase: 32 U/L (ref 11–51)

## 2021-02-27 LAB — CBG MONITORING, ED: Glucose-Capillary: 267 mg/dL — ABNORMAL HIGH (ref 70–99)

## 2021-02-27 MED ORDER — ONDANSETRON HCL 4 MG/2ML IJ SOLN
4.0000 mg | Freq: Once | INTRAMUSCULAR | Status: AC | PRN
  Administered 2021-02-27: 21:00:00 4 mg via INTRAVENOUS
  Filled 2021-02-27: qty 2

## 2021-02-27 MED ORDER — METOCLOPRAMIDE HCL 5 MG/ML IJ SOLN
10.0000 mg | Freq: Once | INTRAMUSCULAR | Status: AC
  Administered 2021-02-27: 10 mg via INTRAVENOUS
  Filled 2021-02-27: qty 2

## 2021-02-27 MED ORDER — LACTATED RINGERS IV BOLUS
1000.0000 mL | Freq: Once | INTRAVENOUS | Status: AC
  Administered 2021-02-27: 1000 mL via INTRAVENOUS

## 2021-02-27 MED ORDER — SODIUM CHLORIDE 0.9 % IV BOLUS
1000.0000 mL | Freq: Once | INTRAVENOUS | Status: AC
  Administered 2021-02-27: 21:00:00 1000 mL via INTRAVENOUS

## 2021-02-27 NOTE — ED Triage Notes (Signed)
Pt arrived via [facility] from home with c/o N/V/D x4 days. Pt pale and sitting in floor outside bathroom in [place]. Pt denies falling but sts, "I just couldn't make it any further. Im so weak." Pt denies GI hx.

## 2021-02-27 NOTE — ED Provider Notes (Addendum)
[facility] Emergency Department Provider Note  ____________________________________________  Time seen: Approximately 11:09 PM  I have reviewed the triage vital signs and the nursing notes.   HISTORY  Chief Complaint Abdominal Pain   HPI Jordan Osborn is a 50 y.o. male with a history of diabetes and hypertension who presents for evaluation of nausea, vomiting and diarrhea.  Patient reports that his symptoms started 3 days ago.  He reports numerous daily episodes of nonbloody nonbilious emesis and watery diarrhea.  Denies abdominal pain.  Has had a mild cough and congestion.  No chest pain or shortness of breath.  Patient reports being unable to keep anything down for 3 days.  Complaining of feeling extremely weak but no fall or syncope.  No fever.    Past Medical History:  Diagnosis Date   Diabetes mellitus without complication ([place])    Hypertension     Patient Active Problem List   Diagnosis Date Noted   CLOS FRACTURE MID/PROXIMAL PHALANX/PHALANG HAND 08/05/2007    Past Surgical History:  Procedure Laterality Date   CHOLECYSTECTOMY     HERNIA REPAIR      Prior to Admission medications   Medication Sig Start Date End Date Taking? Authorizing Provider  ondansetron (ZOFRAN) 4 MG tablet Take 1 tablet (4 mg total) by mouth daily as needed for nausea or vomiting. 02/28/21 02/28/22 Yes Marsel Gail, [place], MD  ciprofloxacin (CIPRO) 500 MG tablet Take 1 tablet (500 mg total) by mouth 2 (two) times daily. 12/11/11   Aviva Signs, MD  glucose monitoring kit (FREESTYLE) monitoring kit 1 each by Does not apply route as needed for other. 10/09/19   Rancour, Annie Main, MD  metFORMIN (GLUCOPHAGE) 500 MG tablet Take 1 tablet (500 mg total) by mouth 2 (two) times daily with a meal. 10/09/19   Rancour, Annie Main, MD    Allergies Benadryl [diphenhydramine hcl], Morphine and related, and Omnipaque [iohexol]  History reviewed. No pertinent family history.  Social  History Social History   Tobacco Use   Smoking status: Never   Smokeless tobacco: Never  Substance Use Topics   Alcohol use: Yes    Comment: occasionally   Drug use: No    Review of Systems  Constitutional: Negative for fever. Eyes: Negative for visual changes. ENT: Negative for sore throat. Neck: No neck pain  Cardiovascular: Negative for chest pain. Respiratory: Negative for shortness of breath. Gastrointestinal: Negative for abdominal pain. + nausea, vomiting and diarrhea. Genitourinary: Negative for dysuria. Musculoskeletal: Negative for back pain. Skin: Negative for rash. Neurological: Negative for headaches, weakness or numbness. Psych: No SI or HI  ____________________________________________   PHYSICAL EXAM:  VITAL SIGNS: ED Triage Vitals [02/27/21 2028]  Enc Vitals Group     BP (!) 173/108     Pulse Rate (!) 105     Resp (!) 21     Temp (!) 97.5 F (36.4 C)     Temp Source Oral     SpO2 99 %     Weight      Height      Head Circumference      Peak Flow      Pain Score      Pain Loc      Pain Edu?      Excl. in [place]?     Constitutional: Alert and oriented. Well appearing and in no apparent distress. HEENT:      Head: Normocephalic and atraumatic.         Eyes: Conjunctivae are  normal. Sclera is non-icteric.       Mouth/Throat: Mucous membranes are dry.       Neck: Supple with no signs of meningismus. Cardiovascular: Tachycardic with regular rhythm. Respiratory: Normal respiratory effort. Lungs are clear to auscultation bilaterally.  Gastrointestinal: Soft, non tender, and non distended with positive bowel sounds. No rebound or guarding. Genitourinary: No CVA tenderness. Musculoskeletal:  No edema, cyanosis, or erythema of extremities. Neurologic: Normal speech and language. Face is symmetric. Moving all extremities. No gross focal neurologic deficits are appreciated. Skin: Skin is warm, dry and intact. No rash noted. Psychiatric: Mood and affect  are normal. Speech and behavior are normal.  ____________________________________________   LABS (all labs ordered are listed, but only abnormal results are displayed)  Labs Reviewed  COMPREHENSIVE METABOLIC PANEL - Abnormal; Notable for the following components:      Result Value   Sodium 132 (*)    Chloride 96 (*)    CO2 16 (*)    Glucose, Bld 241 (*)    BUN 21 (*)    Creatinine, Ser 1.67 (*)    Total Protein 8.7 (*)    Alkaline Phosphatase 128 (*)    Total Bilirubin 2.0 (*)    GFR, Estimated 49 (*)    Anion gap 20 (*)    All other components within normal limits  CBC - Abnormal; Notable for the following components:   WBC 13.5 (*)    RBC 6.08 (*)    Hemoglobin 18.1 (*)    HCT 52.6 (*)    All other components within normal limits  URINALYSIS, ROUTINE W REFLEX MICROSCOPIC - Abnormal; Notable for the following components:   Specific Gravity, Urine >1.030 (*)    Hgb urine dipstick SMALL (*)    Bilirubin Urine SMALL (*)    Ketones, ur >160 (*)    Protein, ur 100 (*)    All other components within normal limits  LACTIC ACID, PLASMA - Abnormal; Notable for the following components:   Lactic Acid, Venous 2.4 (*)    All other components within normal limits  LACTIC ACID, PLASMA - Abnormal; Notable for the following components:   Lactic Acid, Venous 2.4 (*)    All other components within normal limits  BASIC METABOLIC PANEL - Abnormal; Notable for the following components:   Sodium 133 (*)    CO2 17 (*)    Glucose, Bld 232 (*)    Creatinine, Ser 1.54 (*)    GFR, Estimated 54 (*)    Anion gap 16 (*)    All other components within normal limits  BASIC METABOLIC PANEL - Abnormal; Notable for the following components:   Sodium 132 (*)    CO2 19 (*)    Glucose, Bld 212 (*)    Creatinine, Ser 1.53 (*)    Calcium 8.8 (*)    GFR, Estimated 54 (*)    All other components within normal limits  URINALYSIS, MICROSCOPIC (REFLEX) - Abnormal; Notable for the following components:    Bacteria, UA RARE (*)    All other components within normal limits  CBG MONITORING, ED - Abnormal; Notable for the following components:   Glucose-Capillary 267 (*)    All other components within normal limits  CULTURE, BLOOD (ROUTINE X 2)  CULTURE, BLOOD (ROUTINE X 2)  RESP PANEL BY RT-PCR (FLU A&B, COVID) ARPGX2  C DIFFICILE QUICK SCREEN W PCR REFLEX    LIPASE, BLOOD  LACTIC ACID, PLASMA   ____________________________________________  EKG  none  ____________________________________________  RADIOLOGY  I have personally reviewed the images performed during this visit and I agree with the Radiologist's read.   Interpretation by Radiologist:  CT ABDOMEN PELVIS WO CONTRAST  Result Date: 02/28/2021 CLINICAL DATA:  Nausea and vomiting for 4 days. EXAM: CT ABDOMEN AND PELVIS WITHOUT CONTRAST TECHNIQUE: Multidetector CT imaging of the abdomen and pelvis was performed following the standard protocol without IV contrast. COMPARISON:  CT without contrast 10/09/2019, CT with contrast 12/09/2011 FINDINGS: Lower chest: No acute abnormality. Hepatobiliary: No focal abnormality of liver is seen without contrast. The gallbladder is absent with no biliary dilatation. Pancreas: Unremarkable. No pancreatic ductal dilatation or surrounding inflammatory changes. Spleen: Normal in size without focal abnormality. Adrenals/Urinary Tract: There is no adrenal mass. Mild chronic perinephric stranding is unchanged. No urinary stones or hydronephrosis are observed. No focal abnormality of unenhanced renal cortex. There is mild thickening of the bladder versus underdistention. Stomach/Bowel: No dilatation or wall thickening. Surgically absent appendix, as before. Scattered sigmoid diverticula without evidence of acute diverticulitis. Vascular/Lymphatic: Mild aortoiliac atherosclerosis without AAA. No abdominopelvic adenopathy is seen. Reproductive: Mildly enlarged prostate 4.4 cm transverse. Other: No abdominal  wall hernia or abnormality. No abdominopelvic ascites. Multiple pelvic phleboliths. Musculoskeletal: There are mild degenerative changes of the spine. A bone island is again noted in the posterior left ilium. IMPRESSION: 1. No acute noncontrast CT findings or interval changes. 2. Mild thickening of the bladder versus underdistention. No inflammatory change. 3. Prostatomegaly. 4. Diverticulosis without evidence of diverticulitis. Electronically Signed   By: Telford Nab M.D.   On: 02/28/2021 02:00    ____________________________________________   PROCEDURES  Procedure(s) performed: yes .1-3 Lead EKG Interpretation Performed by: Rudene Re, MD Authorized by: Rudene Re, MD     Interpretation: non-specific     ECG rate assessment: tachycardic     Rhythm: sinus tachycardia     Ectopy: none     Conduction: normal     Critical Care performed:  None ____________________________________________   INITIAL IMPRESSION / ASSESSMENT AND PLAN / ED COURSE  50 y.o. male with a history of diabetes and hypertension who presents for evaluation of nausea, vomiting and diarrhea x 3.5 days.  Patient looks very dry, tachycardic, but abdomen is nondistended, no tenderness throughout.  No respiratory distress.  Differential diagnosis including viral gastroenteritis versus colitis versus influenza.  Labs showing significant signs of dehydration with acute kidney injury elevated anion gap and mildly elevated lactic.  Patient also has leukocytosis in the setting of a viral syndrome and hemoconcentration since his hemoglobin is also very high.  We will initiate aggressive IV hydration, antiemetics, and repeat blood work.  Abdomen is soft with no tenderness throughout however with elevated WBC and tachycardia, will get a T to rule out a possible bacterial infection/ sepsis requiring abx.  COVID and flu negative.  _________________________ 5:36 AM on  02/28/2021 ----------------------------------------- CT visualized by me and read by radiology with no acute findings.  After severe IV hydration and tachycardia resolved, lactic acidosis resolved, anion gap also resolved.  All of the lab abnormalities are due to severe dehydration in the setting of a viral syndrome.  Patient is tolerating p.o. with no further episodes of vomiting or any episodes of diarrhea in the emergency room.  Will discharge home with the plan for aggressive rehydration, Zofran for nausea and vomiting close follow-up with primary care doctor.  Recommended return to the emergency room for fever, recurrence of his vomiting and diarrhea, or any other symptoms concerning to him. Patient's HR  during my evaluation is 1      _____________________________________________ Please note:  Patient was evaluated in Emergency Department today for the symptoms described in the history of present illness. Patient was evaluated in the context of the global COVID-19 pandemic, which necessitated consideration that the patient might be at risk for infection with the SARS-CoV-2 virus that causes COVID-19. Institutional protocols and algorithms that pertain to the evaluation of patients at risk for COVID-19 are in a state of rapid change based on information released by regulatory bodies including the CDC and federal and state organizations. These policies and algorithms were followed during the patient's care in the ED.  Some ED evaluations and interventions may be delayed as a result of limited staffing during the pandemic.   [place] Controlled Substance Database was reviewed by me. ____________________________________________   FINAL CLINICAL IMPRESSION(S) / ED DIAGNOSES   Final diagnoses:  Nausea vomiting and diarrhea  Viral gastroenteritis      NEW MEDICATIONS STARTED DURING THIS VISIT:  ED Discharge Orders          Ordered    ondansetron (ZOFRAN) 4 MG tablet  Daily PRN        02/28/21  0535             Note:  This document was prepared using Dragon voice recognition software and may include unintentional dictation errors.    Alfred Levins, [place], MD 02/28/21 [place], [place], [place] 02/28/21 [phone]

## 2021-02-28 ENCOUNTER — Emergency Department: Payer: Medicare Other

## 2021-02-28 DIAGNOSIS — A084 Viral intestinal infection, unspecified: Secondary | ICD-10-CM | POA: Diagnosis not present

## 2021-02-28 LAB — BASIC METABOLIC PANEL
Anion gap: 13 (ref 5–15)
BUN: 20 mg/dL (ref 6–20)
CO2: 19 mmol/L — ABNORMAL LOW (ref 22–32)
Calcium: 8.8 mg/dL — ABNORMAL LOW (ref 8.9–10.3)
Chloride: 100 mmol/L (ref 98–111)
Creatinine, Ser: 1.53 mg/dL — ABNORMAL HIGH (ref 0.61–1.24)
GFR, Estimated: 54 mL/min — ABNORMAL LOW (ref >60)
Glucose, Bld: 212 mg/dL — ABNORMAL HIGH (ref 70–99)
Potassium: 4.4 mmol/L (ref 3.5–5.1)
Sodium: 132 mmol/L — ABNORMAL LOW (ref 135–145)

## 2021-02-28 LAB — URINALYSIS, MICROSCOPIC (REFLEX)

## 2021-02-28 LAB — RESP PANEL BY RT-PCR (FLU A&B, COVID) ARPGX2
Influenza A by PCR: NEGATIVE
Influenza B by PCR: NEGATIVE
SARS Coronavirus 2 by RT PCR: NEGATIVE

## 2021-02-28 LAB — URINALYSIS, ROUTINE W REFLEX MICROSCOPIC
Glucose, UA: NEGATIVE mg/dL
Ketones, ur: >160 mg/dL — AB
Leukocytes,Ua: NEGATIVE
Nitrite: NEGATIVE
Protein, ur: 100 mg/dL — AB
Specific Gravity, Urine: >1.03 — ABNORMAL HIGH (ref 1.005–1.030)
pH: 5.5 (ref 5.0–8.0)

## 2021-02-28 LAB — LACTIC ACID, PLASMA
Lactic Acid, Venous: 2.4 mmol/L (ref 0.5–1.9)
Lactic Acid, Venous: 1.2 mmol/L (ref 0.5–1.9)

## 2021-02-28 MED ORDER — ONDANSETRON HCL 4 MG PO TABS: 4.0000 mg | ORAL_TABLET | Freq: Every day | ORAL | 1 refills | Status: DC | PRN

## 2021-02-28 MED ORDER — ACETAMINOPHEN 500 MG PO TABS
1000.0000 mg | ORAL_TABLET | Freq: Once | ORAL | Status: AC
  Administered 2021-02-28: 03:00:00 1000 mg via ORAL
  Filled 2021-02-28: qty 2

## 2021-02-28 MED ORDER — LACTATED RINGERS IV BOLUS
1000.0000 mL | Freq: Once | INTRAVENOUS | Status: AC
  Administered 2021-02-28: 02:00:00 1000 mL via INTRAVENOUS

## 2021-02-28 NOTE — ED Notes (Signed)
Patient transported to CT 

## 2021-03-04 ENCOUNTER — Inpatient Hospital Stay (HOSPITAL_COMMUNITY)
Admission: EM | Admit: 2021-03-04 | Discharge: 2021-03-06 | DRG: 074 | Disposition: A | Discharge status: Home / Self Care | Payer: Medicare Other | Attending: Internal Medicine | Admitting: Internal Medicine

## 2021-03-04 ENCOUNTER — Inpatient Hospital Stay (HOSPITAL_COMMUNITY): Payer: Medicare Other

## 2021-03-04 ENCOUNTER — Other Ambulatory Visit: Payer: Self-pay

## 2021-03-04 ENCOUNTER — Encounter (HOSPITAL_COMMUNITY): Payer: Self-pay

## 2021-03-04 ENCOUNTER — Emergency Department (HOSPITAL_COMMUNITY): Payer: Medicare Other

## 2021-03-04 DIAGNOSIS — K3184 Gastroparesis: Secondary | ICD-10-CM | POA: Diagnosis present

## 2021-03-04 DIAGNOSIS — E876 Hypokalemia: Secondary | ICD-10-CM | POA: Diagnosis present

## 2021-03-04 DIAGNOSIS — E1165 Type 2 diabetes mellitus with hyperglycemia: Secondary | ICD-10-CM | POA: Diagnosis present

## 2021-03-04 DIAGNOSIS — K529 Noninfective gastroenteritis and colitis, unspecified: Secondary | ICD-10-CM | POA: Diagnosis present

## 2021-03-04 DIAGNOSIS — I10 Essential (primary) hypertension: Secondary | ICD-10-CM | POA: Diagnosis present

## 2021-03-04 DIAGNOSIS — Z9049 Acquired absence of other specified parts of digestive tract: Secondary | ICD-10-CM | POA: Diagnosis not present

## 2021-03-04 DIAGNOSIS — E872 Acidosis, unspecified: Secondary | ICD-10-CM | POA: Diagnosis present

## 2021-03-04 DIAGNOSIS — E1143 Type 2 diabetes mellitus with diabetic autonomic (poly)neuropathy: Principal | ICD-10-CM | POA: Diagnosis present

## 2021-03-04 DIAGNOSIS — Z23 Encounter for immunization: Secondary | ICD-10-CM

## 2021-03-04 DIAGNOSIS — R112 Nausea with vomiting, unspecified: Secondary | ICD-10-CM

## 2021-03-04 DIAGNOSIS — Z7984 Long term (current) use of oral hypoglycemic drugs: Secondary | ICD-10-CM | POA: Diagnosis not present

## 2021-03-04 DIAGNOSIS — Z20822 Contact with and (suspected) exposure to covid-19: Secondary | ICD-10-CM | POA: Diagnosis present

## 2021-03-04 LAB — COMPREHENSIVE METABOLIC PANEL
ALT: 13 U/L (ref 0–44)
AST: 22 U/L (ref 15–41)
Albumin: 4 g/dL (ref 3.5–5.0)
Alkaline Phosphatase: 104 U/L (ref 38–126)
Anion gap: 13 (ref 5–15)
BUN: 18 mg/dL (ref 6–20)
CO2: 31 mmol/L (ref 22–32)
Calcium: 9.3 mg/dL (ref 8.9–10.3)
Chloride: 90 mmol/L — ABNORMAL LOW (ref 98–111)
Creatinine, Ser: 1.07 mg/dL (ref 0.61–1.24)
GFR, Estimated: >60 mL/min (ref >60)
Glucose, Bld: 288 mg/dL — ABNORMAL HIGH (ref 70–99)
Potassium: 3 mmol/L — ABNORMAL LOW (ref 3.5–5.1)
Sodium: 134 mmol/L — ABNORMAL LOW (ref 135–145)
Total Bilirubin: 0.4 mg/dL (ref 0.3–1.2)
Total Protein: 6.9 g/dL (ref 6.5–8.1)

## 2021-03-04 LAB — RAPID URINE DRUG SCREEN, HOSP PERFORMED
Amphetamines: NOT DETECTED
Barbiturates: NOT DETECTED
Benzodiazepines: NOT DETECTED
Cocaine: NOT DETECTED
Opiates: NOT DETECTED
Tetrahydrocannabinol: NOT DETECTED

## 2021-03-04 LAB — URINALYSIS, COMPLETE (UACMP) WITH MICROSCOPIC
Bacteria, UA: NONE SEEN
Bilirubin Urine: NEGATIVE
Glucose, UA: 150 mg/dL — AB
Hgb urine dipstick: NEGATIVE
Ketones, ur: NEGATIVE mg/dL
Leukocytes,Ua: NEGATIVE
Nitrite: NEGATIVE
Protein, ur: NEGATIVE mg/dL
Specific Gravity, Urine: 1.003 — ABNORMAL LOW (ref 1.005–1.030)
pH: 6 (ref 5.0–8.0)

## 2021-03-04 LAB — LACTIC ACID, PLASMA
Lactic Acid, Venous: 5.5 mmol/L (ref 0.5–1.9)
Lactic Acid, Venous: 1.9 mmol/L (ref 0.5–1.9)

## 2021-03-04 LAB — LIPID PANEL
Cholesterol: 144 mg/dL (ref 0–200)
HDL: 32 mg/dL — ABNORMAL LOW (ref >40)
LDL Cholesterol: 93 mg/dL (ref 0–99)
Total CHOL/HDL Ratio: 4.5 RATIO
Triglycerides: 93 mg/dL (ref <150)
VLDL: 19 mg/dL (ref 0–40)

## 2021-03-04 LAB — CBC WITH DIFFERENTIAL/PLATELET
Abs Immature Granulocytes: 0.02 10*3/uL (ref 0.00–0.07)
Basophils Absolute: 0 10*3/uL (ref 0.0–0.1)
Basophils Relative: 0 %
Eosinophils Absolute: 0 10*3/uL (ref 0.0–0.5)
Eosinophils Relative: 1 %
HCT: 40.1 % (ref 39.0–52.0)
Hemoglobin: 14 g/dL (ref 13.0–17.0)
Immature Granulocytes: 0 %
Lymphocytes Relative: 25 %
Lymphs Abs: 1.5 10*3/uL (ref 0.7–4.0)
MCH: 29.9 pg (ref 26.0–34.0)
MCHC: 34.9 g/dL (ref 30.0–36.0)
MCV: 85.7 fL (ref 80.0–100.0)
Monocytes Absolute: 0.8 10*3/uL (ref 0.1–1.0)
Monocytes Relative: 13 %
Neutro Abs: 3.7 10*3/uL (ref 1.7–7.7)
Neutrophils Relative %: 61 %
Platelets: 187 10*3/uL (ref 150–400)
RBC: 4.68 MIL/uL (ref 4.22–5.81)
RDW: 12.7 % (ref 11.5–15.5)
WBC: 6.2 10*3/uL (ref 4.0–10.5)
nRBC: 0 % (ref 0.0–0.2)

## 2021-03-04 LAB — BLOOD GAS, VENOUS
Acid-Base Excess: 3.3 mmol/L — ABNORMAL HIGH (ref 0.0–2.0)
Bicarbonate: 26.7 mmol/L (ref 20.0–28.0)
Drawn by: 1517
FIO2: 28
O2 Saturation: 84.5 %
Patient temperature: 36.2
pCO2, Ven: 44.8 mmHg (ref 44.0–60.0)
pH, Ven: 7.406 (ref 7.250–7.430)
pO2, Ven: 48.4 mmHg — ABNORMAL HIGH (ref 32.0–45.0)

## 2021-03-04 LAB — HIV ANTIBODY (ROUTINE TESTING W REFLEX): HIV Screen 4th Generation wRfx: NONREACTIVE

## 2021-03-04 LAB — CULTURE, BLOOD (ROUTINE X 2)
Culture: NO GROWTH
Culture: NO GROWTH

## 2021-03-04 LAB — HEMOGLOBIN A1C
Hgb A1c MFr Bld: 10.6 % — ABNORMAL HIGH (ref 4.8–5.6)
Mean Plasma Glucose: 257.52 mg/dL

## 2021-03-04 LAB — GLUCOSE, CAPILLARY
Glucose-Capillary: 198 mg/dL — ABNORMAL HIGH (ref 70–99)
Glucose-Capillary: 294 mg/dL — ABNORMAL HIGH (ref 70–99)
Glucose-Capillary: 296 mg/dL — ABNORMAL HIGH (ref 70–99)
Glucose-Capillary: 209 mg/dL — ABNORMAL HIGH (ref 70–99)
Glucose-Capillary: 180 mg/dL — ABNORMAL HIGH (ref 70–99)

## 2021-03-04 LAB — RESP PANEL BY RT-PCR (FLU A&B, COVID) ARPGX2
Influenza A by PCR: NEGATIVE
Influenza B by PCR: NEGATIVE
SARS Coronavirus 2 by RT PCR: NEGATIVE

## 2021-03-04 LAB — PROCALCITONIN: Procalcitonin: <0.1 ng/mL

## 2021-03-04 LAB — MAGNESIUM: Magnesium: 1.6 mg/dL — ABNORMAL LOW (ref 1.7–2.4)

## 2021-03-04 LAB — LIPASE, BLOOD: Lipase: 98 U/L — ABNORMAL HIGH (ref 11–51)

## 2021-03-04 MED ORDER — ACETAMINOPHEN 325 MG PO TABS
650.0000 mg | ORAL_TABLET | Freq: Four times a day (QID) | ORAL | Status: DC | PRN
  Administered 2021-03-04 – 2021-03-06 (×2): 650 mg via ORAL
  Filled 2021-03-04 (×2): qty 2

## 2021-03-04 MED ORDER — INSULIN ASPART 100 UNIT/ML IJ SOLN: 0.0000 [IU] | Freq: Every day | INTRAMUSCULAR | Status: DC

## 2021-03-04 MED ORDER — SODIUM CHLORIDE 0.9 % IV BOLUS
1000.0000 mL | Freq: Once | INTRAVENOUS | Status: AC
  Administered 2021-03-04: 1000 mL via INTRAVENOUS

## 2021-03-04 MED ORDER — POTASSIUM CHLORIDE 10 MEQ/100ML IV SOLN
10.0000 meq | INTRAVENOUS | Status: AC
  Administered 2021-03-04 (×2): 10 meq via INTRAVENOUS
  Filled 2021-03-04 (×2): qty 100

## 2021-03-04 MED ORDER — SODIUM CHLORIDE 0.9 % IV SOLN
25.0000 mg | Freq: Once | INTRAVENOUS | Status: AC
  Administered 2021-03-04: 25 mg via INTRAVENOUS
  Filled 2021-03-04: qty 1

## 2021-03-04 MED ORDER — PROMETHAZINE HCL 25 MG/ML IJ SOLN
INTRAMUSCULAR | Status: AC
  Filled 2021-03-04: qty 1

## 2021-03-04 MED ORDER — ENOXAPARIN SODIUM 40 MG/0.4ML IJ SOSY
40.0000 mg | PREFILLED_SYRINGE | Freq: Every day | INTRAMUSCULAR | Status: DC
  Administered 2021-03-04 – 2021-03-06 (×3): 40 mg via SUBCUTANEOUS
  Filled 2021-03-04 (×3): qty 0.4

## 2021-03-04 MED ORDER — ACETAMINOPHEN 650 MG RE SUPP: 650.0000 mg | Freq: Four times a day (QID) | RECTAL | Status: DC | PRN

## 2021-03-04 MED ORDER — INFLUENZA VAC SPLIT QUAD 0.5 ML IM SUSY
0.5000 mL | PREFILLED_SYRINGE | INTRAMUSCULAR | Status: AC
  Administered 2021-03-05: 0.5 mL via INTRAMUSCULAR
  Filled 2021-03-04: qty 0.5

## 2021-03-04 MED ORDER — METOCLOPRAMIDE HCL 5 MG/ML IJ SOLN
10.0000 mg | Freq: Once | INTRAMUSCULAR | Status: AC
  Administered 2021-03-04: 10 mg via INTRAVENOUS
  Filled 2021-03-04: qty 2

## 2021-03-04 MED ORDER — POTASSIUM CHLORIDE 10 MEQ/100ML IV SOLN
10.0000 meq | INTRAVENOUS | Status: AC
  Administered 2021-03-04 (×3): 10 meq via INTRAVENOUS
  Filled 2021-03-04 (×3): qty 100

## 2021-03-04 MED ORDER — ONDANSETRON HCL 4 MG/2ML IJ SOLN
4.0000 mg | Freq: Four times a day (QID) | INTRAMUSCULAR | Status: DC
  Administered 2021-03-04 – 2021-03-06 (×8): 4 mg via INTRAVENOUS
  Filled 2021-03-04 (×9): qty 2

## 2021-03-04 MED ORDER — INSULIN ASPART 100 UNIT/ML IJ SOLN
0.0000 [IU] | Freq: Three times a day (TID) | INTRAMUSCULAR | Status: DC
  Administered 2021-03-04: 5 [IU] via SUBCUTANEOUS
  Administered 2021-03-04 – 2021-03-05 (×2): 3 [IU] via SUBCUTANEOUS
  Administered 2021-03-05: 1 [IU] via SUBCUTANEOUS
  Administered 2021-03-06: 3 [IU] via SUBCUTANEOUS
  Administered 2021-03-06 (×2): 2 [IU] via SUBCUTANEOUS

## 2021-03-04 MED ORDER — FENTANYL CITRATE PF 50 MCG/ML IJ SOSY
50.0000 ug | PREFILLED_SYRINGE | Freq: Once | INTRAMUSCULAR | Status: AC
  Administered 2021-03-04: 50 ug via INTRAVENOUS
  Filled 2021-03-04: qty 1

## 2021-03-04 MED ORDER — SODIUM CHLORIDE 0.9 % IV SOLN
12.5000 mg | Freq: Four times a day (QID) | INTRAVENOUS | Status: DC | PRN
  Filled 2021-03-04: qty 0.5

## 2021-03-04 MED ORDER — BOOST / RESOURCE BREEZE PO LIQD CUSTOM
1.0000 | Freq: Three times a day (TID) | ORAL | Status: DC
  Administered 2021-03-04 (×2): 1 via ORAL

## 2021-03-04 MED ORDER — MAGNESIUM SULFATE 2 GM/50ML IV SOLN
2.0000 g | Freq: Once | INTRAVENOUS | Status: AC
  Administered 2021-03-04: 2 g via INTRAVENOUS
  Filled 2021-03-04: qty 50

## 2021-03-04 MED ORDER — POTASSIUM CHLORIDE IN NACL 20-0.9 MEQ/L-% IV SOLN: INTRAVENOUS | Status: DC

## 2021-03-04 MED ORDER — ONDANSETRON HCL 4 MG/2ML IJ SOLN
4.0000 mg | Freq: Once | INTRAMUSCULAR | Status: AC
  Administered 2021-03-04: 4 mg via INTRAVENOUS
  Filled 2021-03-04: qty 2

## 2021-03-04 NOTE — ED Triage Notes (Signed)
Pt arrived from home via [facility] w c/o emesis x 5 days. Pt states that he has not kept any thing down. Pt states that he has a stabbing pain on right side of abd 10/10 on pain scale.

## 2021-03-04 NOTE — H&P (Signed)
History and Physical  Jordan Osborn BDZ:[id number] DOB: 1970-09-26 DOA: 03/04/2021   PCP: Patient, No Pcp Per (Inactive)   Patient coming from: Home  Chief Complaint: abd pain, n/v  HPI:  Jordan Osborn is a 50 y.o. male with medical history of hypertension diabetes mellitus presenting with at least 2-week history of nausea, vomiting, diarrhea, abdominal pain.  He is a difficult historian.  He is not able to quantify how often he vomits daily, nor can he quantify how any bowel movements he has on a daily basis.  However he states that his stool has been watery in nature without any blood or black stool.  He has not had any blood in the emesis.  He denies any fevers, chills, chest pain, shortness of breath, coughing, hemoptysis.  He denies any new medications except for Zofran.  He states that he is not taking any prescription medications at this time.  He stated that his previous physicians had taken him off his antihypertensive and diabetic medications.  He is not able to tell male with a degree of certainty how [place] has been off all his prescription medications.  He denies any tobacco use, alcohol use, or illegal drug use.  He states that he used to drink beer daily but has not drank any alcohol in over a year. He visited the ED at [facility] on 02/27/2021 with similar complaints.  He was treated symptomatically and released when his CT abdomen was reassuring.  Because of persistent nausea, vomiting, diarrhea, abdominal pain, he presented to get further evaluation and treatment.  He states that he has not been able to tolerate any p.o. intake. ED In the emergency department, the patient was afebrile and hemodynamically stable with oxygen saturation 100% room air.  WBC 6.2, hemoglobin 14.0, platelets 187,000.  AST 22, ALT 13, alk phosphatase 104, total bilirubin 0.4, lipase 98.  Sodium 134, potassium 3.0, bicarbonate 31, serum creatinine 1.27.  The patient was given Zofran, Phenergan, and  3 L of normal saline.  Assessment/Plan: Intractable nausea, vomiting, diarrhea -Etiology unclear, work-up in progress -03/04/21 CT abdomen pelvis--no acute findings, particularly no pancreatic stranding -Zofran around-the-clock -Continue IV fluids -Abdominal exam fairly benign with minimal epigastric pain -UA and urine culture -Urine drug screen -Protonix twice daily  Elevated lipase -Unclear clinical significance presently -03/04/2021 CT abdomen--no pancreatic stranding; no biliary ductal dilatation -Repeat lipase in the morning  Lactic acidosis -Lactic acid 5.5 -Continue IV fluids -Continue to trend -Check PCT -Blood cultures x2 sets -UA and urine culture -Lipid panel  Diarrhea -C. difficile assay -Stool pathogen panel  Diabetes mellitus type 2 -Hemoglobin A1c -NovoLog sliding scale -Holding metformin  Essential hypertension -Patient was not on any outpatient medications  Hypokalemia -Replete -Check magnesium       Past Medical History:  Diagnosis Date   Diabetes mellitus without complication ([place])    Hypertension    Past Surgical History:  Procedure Laterality Date   CHOLECYSTECTOMY     HERNIA REPAIR     Social History:  reports that he has never smoked. He has never used smokeless tobacco. He reports current alcohol use. He reports that he does not use drugs.   History reviewed. No pertinent family history.   Allergies  Allergen Reactions   Benadryl [Diphenhydramine Hcl]    Morphine And Related    Omnipaque [Iohexol] Hives and Itching    Patient experienced hive and severe itching whole body after Omnipaque injection.  Solumedrol and pepsed  administered in ED after exam     Prior to Admission medications   Medication Sig Start Date End Date Taking? Authorizing Provider  ciprofloxacin (CIPRO) 500 MG tablet Take 1 tablet (500 mg total) by mouth 2 (two) times daily. 12/11/11   Aviva Signs, MD  glucose monitoring kit (FREESTYLE) monitoring  kit 1 each by Does not apply route as needed for other. 10/09/19   Rancour, Annie Main, MD  metFORMIN (GLUCOPHAGE) 500 MG tablet Take 1 tablet (500 mg total) by mouth 2 (two) times daily with a meal. 10/09/19   Rancour, Annie Main, MD  ondansetron (ZOFRAN) 4 MG tablet Take 1 tablet (4 mg total) by mouth daily as needed for nausea or vomiting. 02/28/21 02/28/22  Rudene Re, MD    Review of Systems:  Constitutional:  No weight loss, night sweats, Fevers, chills, fatigue.  Head&Eyes: No headache.  No vision loss.  No eye pain or scotoma ENT:  No Difficulty swallowing,Tooth/dental problems,Sore throat,  No ear ache, post nasal drip,  Cardio-vascular:  No chest pain, Orthopnea, PND, swelling in lower extremities,  dizziness, palpitations  GI:  No  hematochezia, melena, heartburn, indigestion, Resp:  No shortness of breath with exertion or at rest. No cough. No coughing up of blood .No wheezing.No chest wall deformity  Skin:  no rash or lesions.  GU:  no dysuria, change in color of urine, no urgency or frequency. No flank pain.  Musculoskeletal:  No joint pain or swelling. No decreased range of motion. No back pain.  Psych:  No change in mood or affect. No depression or anxiety. Neurologic: No headache, no dysesthesia, no focal weakness, no vision loss. No syncope  Physical Exam: Vitals:   03/04/21 0409 03/04/21 0413 03/04/21 0500  BP:  (!) 155/98 (!) 155/78  Pulse:  (!) 102 82  Resp:  20 18  Temp:  (!) 97.2 F (36.2 C)   TempSrc:  Oral   SpO2:  100% 99%  Weight: 85.8 kg    Height: 6' (1.829 m)     General:  A&O x 3, NAD, nontoxic, pleasant/cooperative Head/Eye: No conjunctival hemorrhage, no icterus, [place]/AT, No nystagmus ENT:  No icterus,  No thrush, good dentition, no pharyngeal exudate Neck:  No masses, no lymphadenpathy, no bruits CV:  RRR, no rub, no gallop, no S3 Lung:  CTAB, good air movement, no wheeze, no rhonchi Abdomen: soft/NT, +BS, nondistended, no peritoneal  signs Ext: No cyanosis, No rashes, No petechiae, No lymphangitis, No edema Neuro: CNII-XII intact, strength 4/5 in bilateral upper and lower extremities, no dysmetria  Labs on Admission:  Basic Metabolic Panel: Recent Labs  Lab 02/27/21 2036 02/27/21 2312 02/28/21 0056 03/04/21 0415  NA 132* 133* 132* 134*  K 4.6 4.4 4.4 3.0*  CL 96* 100 100 90*  CO2 16* 17* 19* 31  GLUCOSE 241* 232* 212* 288*  BUN 21* 20 20 18   CREATININE 7.56* 1.54* 1.53* 1.07  CALCIUM 9.7 9.3 8.8* 9.3   Liver Function Tests: Recent Labs  Lab 02/27/21 2036 03/04/21 0415  AST 26 22  ALT 21 13  ALKPHOS 128* 104  BILITOT 2.0* 0.4  PROT 8.7* 6.9  ALBUMIN 4.5 4.0   Recent Labs  Lab 02/27/21 2036 03/04/21 0415  LIPASE 32 98*   No results for input(s): AMMONIA in the last 168 hours. CBC: Recent Labs  Lab 02/27/21 2036 03/04/21 0415  WBC 13.5* 6.2  NEUTROABS  --  3.7  HGB 18.1* 14.0  HCT 52.6* 40.1  MCV 86.5 85.7  PLT 250 187   Coagulation Profile: No results for input(s): INR, PROTIME in the last 168 hours. Cardiac Enzymes: No results for input(s): CKTOTAL, CKMB, CKMBINDEX, TROPONINI in the last 168 hours. BNP: Invalid input(s): POCBNP CBG: Recent Labs  Lab 02/27/21 2037  GLUCAP 267*   Urine analysis:    Component Value Date/Time   COLORURINE YELLOW 02/28/2021 0056   APPEARANCEUR CLEAR 02/28/2021 0056   LABSPEC >1.030 (H) 02/28/2021 0056   PHURINE 5.5 02/28/2021 0056   GLUCOSEU NEGATIVE 02/28/2021 0056   HGBUR SMALL (A) 02/28/2021 0056   BILIRUBINUR SMALL (A) 02/28/2021 0056   KETONESUR >160 (A) 02/28/2021 0056   PROTEINUR 100 (A) 02/28/2021 0056   UROBILINOGEN 0.2 12/09/2011 1955   NITRITE NEGATIVE 02/28/2021 0056   LEUKOCYTESUR NEGATIVE 02/28/2021 0056   Sepsis Labs: @LABRCNTIP (procalcitonin:4,lacticidven:4) ) Recent Results (from the past 240 hour(s))  Culture, blood (routine x 2)     Status: None   Collection Time: 02/27/21 11:12 PM   Specimen: BLOOD  Result Value  Ref Range Status   Specimen Description BLOOD RIGHT ANTECUBITAL  Final   Special Requests   Final    BOTTLES DRAWN AEROBIC AND ANAEROBIC Blood Culture results may not be optimal due to an excessive volume of blood received in culture bottles   Culture   Final    NO GROWTH 5 DAYS Performed at [facility], [address] [place], [place] [address]    Report Status 03/04/2021 FINAL  Final  Culture, blood (routine x 2)     Status: None   Collection Time: 02/27/21 11:12 PM   Specimen: BLOOD  Result Value Ref Range Status   Specimen Description BLOOD LEFT ANTECUBITAL  Final   Special Requests   Final    BOTTLES DRAWN AEROBIC AND ANAEROBIC Blood Culture results may not be optimal due to an excessive volume of blood received in culture bottles   Culture   Final    NO GROWTH 5 DAYS Performed at [facility], [address] [place], [place] [address]    Report Status 03/04/2021 FINAL  Final  Resp Panel by RT-PCR (Flu A&B, Covid) Nasopharyngeal Swab     Status: None   Collection Time: 02/27/21 11:12 PM   Specimen: Nasopharyngeal Swab; Nasopharyngeal(NP) swabs in vial transport medium  Result Value Ref Range Status   SARS Coronavirus 2 by RT PCR NEGATIVE NEGATIVE Final    Comment: (NOTE) SARS-CoV-2 target nucleic acids are NOT DETECTED.  The SARS-CoV-2 RNA is generally detectable in upper respiratory specimens during the acute phase of infection. The lowest concentration of SARS-CoV-2 viral copies this assay can detect is 138 copies/mL. A negative result does not preclude SARS-Cov-2 infection and should not be used as the sole basis for treatment or other patient management decisions. A negative result may occur with  improper specimen collection/handling, submission of specimen other than nasopharyngeal swab, presence of viral mutation(s) within the areas targeted by this assay, and inadequate number of viral copies(<138 copies/mL). A negative result must be combined  with clinical observations, patient history, and epidemiological information. The expected result is Negative.  Fact Sheet for Patients:  EntrepreneurPulse.com.au  Fact Sheet for Healthcare Providers:  IncredibleEmployment.be  This test is no t yet approved or cleared by the [place] FDA and  has been authorized for detection and/or diagnosis of SARS-CoV-2 by FDA under an Emergency Use Authorization (EUA). This EUA will remain  in effect (meaning this test can be used) for the duration of the COVID-19 declaration  under Section 564(b)(1) of the Act, 21 U.S.C.section 360bbb-3(b)(1), unless the authorization is terminated  or revoked sooner.       Influenza A by PCR NEGATIVE NEGATIVE Final   Influenza B by PCR NEGATIVE NEGATIVE Final    Comment: (NOTE) The Xpert Xpress SARS-CoV-2/FLU/RSV plus assay is intended as an aid in the diagnosis of influenza from Nasopharyngeal swab specimens and should not be used as a sole basis for treatment. Nasal washings and aspirates are unacceptable for Xpert Xpress SARS-CoV-2/FLU/RSV testing.  Fact Sheet for Patients: EntrepreneurPulse.com.au  Fact Sheet for Healthcare Providers: IncredibleEmployment.be  This test is not yet approved or cleared by the [place] FDA and has been authorized for detection and/or diagnosis of SARS-CoV-2 by FDA under an Emergency Use Authorization (EUA). This EUA will remain in effect (meaning this test can be used) for the duration of the COVID-19 declaration under Section 564(b)(1) of the Act, 21 U.S.C. section 360bbb-3(b)(1), unless the authorization is terminated or revoked.  Performed at [facility], [address] [place], [place] [address]   Resp Panel by RT-PCR (Flu A&B, Covid) Nasopharyngeal Swab     Status: None   Collection Time: 03/04/21  5:37 AM   Specimen: Nasopharyngeal Swab; Nasopharyngeal(NP) swabs in  vial transport medium  Result Value Ref Range Status   SARS Coronavirus 2 by RT PCR NEGATIVE NEGATIVE Final    Comment: (NOTE) SARS-CoV-2 target nucleic acids are NOT DETECTED.  The SARS-CoV-2 RNA is generally detectable in upper respiratory specimens during the acute phase of infection. The lowest concentration of SARS-CoV-2 viral copies this assay can detect is 138 copies/mL. A negative result does not preclude SARS-Cov-2 infection and should not be used as the sole basis for treatment or other patient management decisions. A negative result may occur with  improper specimen collection/handling, submission of specimen other than nasopharyngeal swab, presence of viral mutation(s) within the areas targeted by this assay, and inadequate number of viral copies(<138 copies/mL). A negative result must be combined with clinical observations, patient history, and epidemiological information. The expected result is Negative.  Fact Sheet for Patients:  EntrepreneurPulse.com.au  Fact Sheet for Healthcare Providers:  IncredibleEmployment.be  This test is no t yet approved or cleared by the [place] FDA and  has been authorized for detection and/or diagnosis of SARS-CoV-2 by FDA under an Emergency Use Authorization (EUA). This EUA will remain  in effect (meaning this test can be used) for the duration of the COVID-19 declaration under Section 564(b)(1) of the Act, 21 U.S.C.section 360bbb-3(b)(1), unless the authorization is terminated  or revoked sooner.       Influenza A by PCR NEGATIVE NEGATIVE Final   Influenza B by PCR NEGATIVE NEGATIVE Final    Comment: (NOTE) The Xpert Xpress SARS-CoV-2/FLU/RSV plus assay is intended as an aid in the diagnosis of influenza from Nasopharyngeal swab specimens and should not be used as a sole basis for treatment. Nasal washings and aspirates are unacceptable for Xpert Xpress SARS-CoV-2/FLU/RSV testing.  Fact  Sheet for Patients: EntrepreneurPulse.com.au  Fact Sheet for Healthcare Providers: IncredibleEmployment.be  This test is not yet approved or cleared by the [place] FDA and has been authorized for detection and/or diagnosis of SARS-CoV-2 by FDA under an Emergency Use Authorization (EUA). This EUA will remain in effect (meaning this test can be used) for the duration of the COVID-19 declaration under Section 564(b)(1) of the Act, 21 U.S.C. section 360bbb-3(b)(1), unless the authorization is terminated or revoked.  Performed at [facility],  [address], [place], [place] [address]      Radiological Exams on Admission: DG Abdomen Acute W/Chest  Result Date: 03/04/2021 CLINICAL DATA:  Abdominal pain and vomiting. EXAM: DG ABDOMEN ACUTE WITH 1 VIEW CHEST COMPARISON:  02/28/2021 FINDINGS: There is no evidence of dilated bowel loops or free intraperitoneal air. Moderate stool burden noted. Gas is seen throughout the colon up to the rectum. No radiopaque calculi or other significant radiographic abnormality is seen. Heart size and mediastinal contours are within normal limits. Both lungs are clear. IMPRESSION: 1. Nonobstructive bowel gas pattern. 2. Moderate stool burden within the colon suggestive of constipation. Electronically Signed   By: Kerby Moors M.D.   On: 03/04/2021 05:57       Time spent:60 minutes Code Status:   FULL Family Communication:  No Family at bedside Disposition Plan: expect 2 day hospitalization Consults called: none DVT Prophylaxis: [place] Lovenox  Orson Eva, DO  [facility] Pager [phone]  If 7PM-7AM, please contact night-coverage [email or web address] Password [facility] 03/04/2021, 7:48 AM

## 2021-03-04 NOTE — ED Notes (Signed)
Date and time results received: 03/04/21 0602 (use smartphrase ".now" to insert current time)  Test: Lactic acid Critical Value: 5.5  Name of Provider Notified: Randel Books, MD

## 2021-03-04 NOTE — ED Provider Notes (Signed)
[facility] Provider Note   CSN: [id number] Arrival date & time: 03/04/21  0358     History Chief Complaint  Patient presents with   Emesis    Jordan Osborn is a 50 y.o. male.  Patient presents via EMS with nausea, vomiting, diarrhea and abdominal pain for the past 2 weeks.  States he is unable to keep anything down and is having multiple times a day every day.  Denies any black or bloody stools.  Denies any blood in his emesis.  He was seen for similar symptoms at [facility] on December 26 and told he had gastroenteritis.  States he is no better not able to keep any medications down at home as well. He had a previous cholecystectomy in 2016 and has had issues with abdominal pain and vomiting ever since then.  He states he was taken off of his blood pressure medication metformin by his primary doctors and has not had anything for his diabetes or blood pressure currently. He has sharp stabbing right-sided abdominal pain that is constant and worse when he tries to vomit.  He denies any pain with urination or blood in the urine.  No fever.  No chest pain or shortness of breath He has never been diagnosed with gastroparesis.  He does not think he is ever had an EGD.  The history is provided by the patient and the EMS personnel.  Emesis Associated symptoms: abdominal pain and diarrhea   Associated symptoms: no arthralgias, no cough, no fever, no headaches and no myalgias       Past Medical History:  Diagnosis Date   Diabetes mellitus without complication ([place])    Hypertension     Patient Active Problem List   Diagnosis Date Noted   CLOS FRACTURE MID/PROXIMAL PHALANX/PHALANG HAND 08/05/2007    Past Surgical History:  Procedure Laterality Date   CHOLECYSTECTOMY     HERNIA REPAIR         History reviewed. No pertinent family history.  Social History   Tobacco Use   Smoking status: Never   Smokeless tobacco: Never  Substance Use Topics    Alcohol use: Yes    Comment: occasionally   Drug use: No    Home Medications Prior to Admission medications   Medication Sig Start Date End Date Taking? Authorizing Provider  ciprofloxacin (CIPRO) 500 MG tablet Take 1 tablet (500 mg total) by mouth 2 (two) times daily. 12/11/11   Aviva Signs, MD  glucose monitoring kit (FREESTYLE) monitoring kit 1 each by Does not apply route as needed for other. 10/09/19   Myosha Cuadras, Annie Main, MD  metFORMIN (GLUCOPHAGE) 500 MG tablet Take 1 tablet (500 mg total) by mouth 2 (two) times daily with a meal. 10/09/19   Shenetta Schnackenberg, Annie Main, MD  ondansetron (ZOFRAN) 4 MG tablet Take 1 tablet (4 mg total) by mouth daily as needed for nausea or vomiting. 02/28/21 02/28/22  Rudene Re, MD    Allergies    Benadryl [diphenhydramine hcl], Morphine and related, and Omnipaque [iohexol]  Review of Systems   Review of Systems  Constitutional:  Positive for activity change, appetite change and fatigue. Negative for fever.  HENT:  Negative for congestion and rhinorrhea.   Respiratory:  Negative for cough, chest tightness and shortness of breath.   Gastrointestinal:  Positive for abdominal pain, diarrhea, nausea and vomiting.  Genitourinary:  Negative for dysuria and hematuria.  Musculoskeletal:  Negative for arthralgias and myalgias.  Skin:  Negative for rash.  Neurological:  Negative for dizziness, weakness and headaches.   all other systems are negative except as noted in the HPI and PMH.   Physical Exam Updated Vital Signs BP (!) 155/98 (BP Location: Right Arm)    Pulse (!) 102    Temp (!) 97.2 F (36.2 C) (Oral)    Resp 20    Ht 6' (1.829 m)    Wt 85.8 kg    SpO2 100%    BMI 25.65 kg/m   Physical Exam Vitals and nursing note reviewed.  Constitutional:      General: He is in acute distress.     Appearance: He is well-developed.     Comments: Uncomfortable, dry heaving  HENT:     Head: Normocephalic and atraumatic.     Mouth/Throat:     Pharynx: No  oropharyngeal exudate.  Eyes:     Conjunctiva/sclera: Conjunctivae normal.     Pupils: Pupils are equal, round, and reactive to light.  Neck:     Comments: No meningismus. Cardiovascular:     Rate and Rhythm: Regular rhythm. Tachycardia present.     Heart sounds: Normal heart sounds. No murmur heard. Pulmonary:     Effort: Pulmonary effort is normal. No respiratory distress.     Breath sounds: Normal breath sounds.  Abdominal:     Palpations: Abdomen is soft.     Tenderness: There is abdominal tenderness. There is guarding. There is no rebound.     Comments: Diffuse tenderness, worse in the epigastrium, no rebound.  Musculoskeletal:        General: No tenderness. Normal range of motion.     Cervical back: Normal range of motion and neck supple.  Skin:    General: Skin is warm.  Neurological:     Mental Status: He is alert and oriented to person, place, and time.     Cranial Nerves: No cranial nerve deficit.     Motor: No abnormal muscle tone.     Coordination: Coordination normal.     Comments:  5/5 strength throughout. CN 2-12 intact.Equal grip strength.   Psychiatric:        Behavior: Behavior normal.    ED Results / Procedures / Treatments   Labs (all labs ordered are listed, but only abnormal results are displayed) Labs Reviewed  COMPREHENSIVE METABOLIC PANEL - Abnormal; Notable for the following components:      Result Value   Sodium 134 (*)    Potassium 3.0 (*)    Chloride 90 (*)    Glucose, Bld 288 (*)    All other components within normal limits  LACTIC ACID, PLASMA - Abnormal; Notable for the following components:   Lactic Acid, Venous 5.5 (*)    All other components within normal limits  LIPASE, BLOOD - Abnormal; Notable for the following components:   Lipase 98 (*)    All other components within normal limits  BLOOD GAS, VENOUS - Abnormal; Notable for the following components:   pO2, Ven 48.4 (*)    Acid-Base Excess 3.3 (*)    All other components within  normal limits  RESP PANEL BY RT-PCR (FLU A&B, COVID) ARPGX2  CBC WITH DIFFERENTIAL/PLATELET  LACTIC ACID, PLASMA  URINALYSIS, ROUTINE W REFLEX MICROSCOPIC  CBG MONITORING, ED    EKG EKG Interpretation  Date/Time:  Saturday March 04 2021 04:13:11 EST Ventricular Rate:  100 PR Interval:  152 QRS Duration: 103 QT Interval:  386 QTC Calculation: 498 R Axis:   82 Text Interpretation: Sinus tachycardia Probable left ventricular  hypertrophy ST elev, probable normal early repol pattern Borderline prolonged QT interval No significant change was found Confirmed by Ezequiel Essex [phone]) on 03/04/2021 4:47:03 AM  Radiology DG Abdomen Acute W/Chest  Result Date: 03/04/2021 CLINICAL DATA:  Abdominal pain and vomiting. EXAM: DG ABDOMEN ACUTE WITH 1 VIEW CHEST COMPARISON:  02/28/2021 FINDINGS: There is no evidence of dilated bowel loops or free intraperitoneal air. Moderate stool burden noted. Gas is seen throughout the colon up to the rectum. No radiopaque calculi or other significant radiographic abnormality is seen. Heart size and mediastinal contours are within normal limits. Both lungs are clear. IMPRESSION: 1. Nonobstructive bowel gas pattern. 2. Moderate stool burden within the colon suggestive of constipation. Electronically Signed   By: Kerby Moors M.D.   On: 03/04/2021 05:57    Procedures Procedures   Medications Ordered in ED Medications  sodium chloride 0.9 % bolus 1,000 mL (1,000 mLs Intravenous New Bag/Given 03/04/21 0435)  ondansetron (ZOFRAN) injection 4 mg (4 mg Intravenous Given 03/04/21 0436)  fentaNYL (SUBLIMAZE) injection 50 mcg (50 mcg Intravenous Given 03/04/21 0435)  metoCLOPramide (REGLAN) injection 10 mg (10 mg Intravenous Given 03/04/21 0438)    ED Course  I have reviewed the triage vital signs and the nursing notes.  Pertinent labs & imaging results that were available during my care of the patient were reviewed by me and considered in my medical decision  making (see chart for details).    MDM Rules/Calculators/A&P                         Recurrent nausea and vomiting and diarrhea.  History of untreated diabetes and hypertension.  Abdomen soft without peritoneal signs.  Patient given IV fluids and symptom control.  Labs show hyperglycemia with normal anion gap patient given IV fluids and symptom control.    Lactate 5.5.  Patient tachycardic.  Abdomen is soft diffusely tender.  CT scan from December 26 reviewed Lipase 98 with normal LFTs.  Hypokalemia 3.0 be replaced.  Patient still with ongoing symptoms including nausea and dry heaving and abdominal pain.  Lactic acidosis there is concern for possible intra-abdominal pathology will obtain CT scan.  May have undiagnosed gastroparesis given his diabetes  Patient still with nausea and dry heaving and pain.  Given his lactic acidosis we will plan admission for rehydration and will also repeat CT scan.  Suspect likely undiagnosed gastroparesis with possible cannabinoid hyperemesis syndrome as well.  IV fluids and symptom control.  No evidence of DKA.  Admission d/w Dr. Josephine Cables.      Final Clinical Impression(s) / ED Diagnoses Final diagnoses:  Intractable nausea and vomiting  Lactic acidosis    Rx / DC Orders ED Discharge Orders     None        Rithika Seel, Annie Main, MD 03/04/21 [phone]

## 2021-03-05 LAB — COMPREHENSIVE METABOLIC PANEL
ALT: 10 U/L (ref 0–44)
AST: 15 U/L (ref 15–41)
Albumin: 3.2 g/dL — ABNORMAL LOW (ref 3.5–5.0)
Alkaline Phosphatase: 78 U/L (ref 38–126)
Anion gap: 9 (ref 5–15)
BUN: 5 mg/dL — ABNORMAL LOW (ref 6–20)
CO2: 28 mmol/L (ref 22–32)
Calcium: 8.3 mg/dL — ABNORMAL LOW (ref 8.9–10.3)
Chloride: 102 mmol/L (ref 98–111)
Creatinine, Ser: 0.85 mg/dL (ref 0.61–1.24)
GFR, Estimated: >60 mL/min (ref >60)
Glucose, Bld: 195 mg/dL — ABNORMAL HIGH (ref 70–99)
Potassium: 3.3 mmol/L — ABNORMAL LOW (ref 3.5–5.1)
Sodium: 139 mmol/L (ref 135–145)
Total Bilirubin: 0.5 mg/dL (ref 0.3–1.2)
Total Protein: 5.8 g/dL — ABNORMAL LOW (ref 6.5–8.1)

## 2021-03-05 LAB — CBC
HCT: 37.1 % — ABNORMAL LOW (ref 39.0–52.0)
Hemoglobin: 12.5 g/dL — ABNORMAL LOW (ref 13.0–17.0)
MCH: 29.5 pg (ref 26.0–34.0)
MCHC: 33.7 g/dL (ref 30.0–36.0)
MCV: 87.5 fL (ref 80.0–100.0)
Platelets: 165 10*3/uL (ref 150–400)
RBC: 4.24 MIL/uL (ref 4.22–5.81)
RDW: 12.8 % (ref 11.5–15.5)
WBC: 6.1 10*3/uL (ref 4.0–10.5)
nRBC: 0 % (ref 0.0–0.2)

## 2021-03-05 LAB — GLUCOSE, CAPILLARY
Glucose-Capillary: 228 mg/dL — ABNORMAL HIGH (ref 70–99)
Glucose-Capillary: 123 mg/dL — ABNORMAL HIGH (ref 70–99)
Glucose-Capillary: 120 mg/dL — ABNORMAL HIGH (ref 70–99)
Glucose-Capillary: 163 mg/dL — ABNORMAL HIGH (ref 70–99)

## 2021-03-05 LAB — C DIFFICILE QUICK SCREEN W PCR REFLEX
C Diff antigen: NEGATIVE
C Diff interpretation: NOT DETECTED
C Diff toxin: NEGATIVE

## 2021-03-05 LAB — LIPASE, BLOOD: Lipase: 115 U/L — ABNORMAL HIGH (ref 11–51)

## 2021-03-05 LAB — URINE CULTURE: Culture: NO GROWTH

## 2021-03-05 LAB — HEMOGLOBIN A1C
Hgb A1c MFr Bld: 10.8 % — ABNORMAL HIGH (ref 4.8–5.6)
Mean Plasma Glucose: 263.26 mg/dL

## 2021-03-05 LAB — MAGNESIUM: Magnesium: 2.3 mg/dL (ref 1.7–2.4)

## 2021-03-05 MED ORDER — COVID-19MRNA BIVAL VAC MODERNA 50 MCG/0.5ML IM SUSP
0.5000 mL | Freq: Once | INTRAMUSCULAR | Status: AC
  Administered 2021-03-06: 0.5 mL via INTRAMUSCULAR
  Filled 2021-03-05: qty 0.5

## 2021-03-05 MED ORDER — PANTOPRAZOLE SODIUM 40 MG PO TBEC
40.0000 mg | DELAYED_RELEASE_TABLET | Freq: Two times a day (BID) | ORAL | Status: DC
  Administered 2021-03-05 – 2021-03-06 (×3): 40 mg via ORAL
  Filled 2021-03-05 (×3): qty 1

## 2021-03-05 MED ORDER — POTASSIUM CHLORIDE CRYS ER 20 MEQ PO TBCR
20.0000 meq | EXTENDED_RELEASE_TABLET | Freq: Once | ORAL | Status: AC
  Administered 2021-03-05: 20 meq via ORAL
  Filled 2021-03-05: qty 1

## 2021-03-05 NOTE — Plan of Care (Signed)

## 2021-03-05 NOTE — Progress Notes (Signed)
PROGRESS NOTE  Jordan Osborn ZOX:[id number] DOB: [birth year] DOA: 03/04/2021 PCP: Patient, No Pcp Per (Inactive)  Brief History:  51 y.o. male with medical history of hypertension diabetes mellitus presenting with at least 2-week history of nausea, vomiting, diarrhea, abdominal pain.  He is a difficult historian.  He is not able to quantify how often he vomits daily, nor can he quantify how any bowel movements he has on a daily basis.  However he states that his stool has been watery in nature without any blood or black stool.  He has not had any blood in the emesis.  He denies any fevers, chills, chest pain, shortness of breath, coughing, hemoptysis.  He denies any new medications except for Zofran.  He states that he is not taking any prescription medications at this time.  He stated that his previous physicians had taken him off his antihypertensive and diabetic medications.  He is not able to tell me with a degree of certainty how Zoll has been off all his prescription medications.  He denies any tobacco use, alcohol use, or illegal drug use.  He states that he used to drink beer daily but has not drank any alcohol in over a year. He visited the ED at [facility] on 02/27/2021 with similar complaints.  He was treated symptomatically and released when his CT abdomen was reassuring.  Because of persistent nausea, vomiting, diarrhea, abdominal pain, he presented to get further evaluation and treatment.  He states that he has not been able to tolerate any p.o. intake. ED In the emergency department, the patient was afebrile and hemodynamically stable with oxygen saturation 100% room air.  WBC 6.2, hemoglobin 14.0, platelets 187,000.  AST 22, ALT 13, alk phosphatase 104, total bilirubin 0.4, lipase 98.  Sodium 134, potassium 3.0, bicarbonate 31, serum creatinine 1.27.  The patient was given Zofran, Phenergan, and 3 L of normal saline.    Assessment/Plan: Intractable nausea, vomiting,  diarrhea -Etiology unclear, work-up in progress -due to acute gastroenteritis with exacerbation of presumptive gastroparesis -03/04/21 CT abdomen pelvis--no acute findings, particularly no pancreatic stranding -Zofran around-the-clock -Continue IV fluids -Abdominal exam fairly benign with minimal epigastric pain -UA--no pyuria -Urine drug screen--neg -Protonix twice daily   Elevated lipase -Unclear clinical significance presently -03/04/2021 CT abdomen--no pancreatic stranding; no biliary ductal dilatation -Lipase 98>>115 -repeat lipase in am -triglycerides 93   Lactic acidosis -Lactic acid 5.5>>1.9 -Continue IV fluids -Continued to trend -Check PCT<0.10 -Blood cultures x2 sets -UA--no pyuria   Diarrhea -C. difficile assay--no BM to collect -Stool pathogen panel--no BM to collect   Diabetes mellitus type 2, uncontrolled with hyperglycemia -Hemoglobin A1c--10.6 -NovoLog sliding scale -Holding metformin   Essential hypertension -Patient was not on any outpatient medications   Hypokalemia/Hypomagnesemia -Repleted            Family Communication:   no Family at bedside  Consultants:  none  Code Status:  FULL   DVT Prophylaxis:  [place] Lovenox   Procedures: As Listed in Progress Note Above  Antibiotics: None      Subjective: Pt is feeling better.  He denies f/c, cp, n/v/d, abd pain.  Objective: Vitals:   03/04/21 0814 03/04/21 0829 03/04/21 1408 03/04/21 2102  BP:  (!) 148/86 140/78 (!) 153/85  Pulse:  86 92 75  Resp:  20 19 18   Temp: 98.1 F (36.7 C) (!) 97.4 F (36.3 C) 98.7 F (37.1 C) 97.8 F (36.6 C)  TempSrc: Oral Oral Oral Oral  SpO2:  100% 100% 100%  Weight:      Height:        Intake/Output Summary (Last 24 hours) at 03/05/2021 1336 Last data filed at 03/05/2021 0900 Gross per 24 hour  Intake 3278.07 ml  Output --  Net 3278.07 ml   Weight change:  Exam:  General:  Pt is alert, follows commands appropriately, not in acute  distress HEENT: No icterus, No thrush, No neck mass, [place]/AT Cardiovascular: RRR, S1/S2, no rubs, no gallops Respiratory: CTA bilaterally, no wheezing, no crackles, no rhonchi Abdomen: Soft/+BS, non tender, non distended, no guarding Extremities: No edema, No lymphangitis, No petechiae, No rashes, no synovitis   Data Reviewed: I have personally reviewed following labs and imaging studies Basic Metabolic Panel: Recent Labs  Lab 02/27/21 2036 02/27/21 2312 02/28/21 0056 03/04/21 0415 03/04/21 0752 03/05/21 0439  NA 132* 133* 132* 134*  --  139  K 4.6 4.4 4.4 3.0*  --  3.3*  CL 96* 100 100 90*  --  102  CO2 16* 17* 19* 31  --  28  GLUCOSE 241* 232* 212* 288*  --  195*  BUN 21* 20 20 18   --  5*  CREATININE 1.67* 1.54* 1.53* 1.07  --  0.85  CALCIUM 9.7 9.3 8.8* 9.3  --  8.3*  MG  --   --   --   --  1.6* 2.3   Liver Function Tests: Recent Labs  Lab 02/27/21 2036 03/04/21 0415 03/05/21 0439  AST 26 22 15   ALT 21 13 10   ALKPHOS 128* 104 78  BILITOT 2.0* 0.4 0.5  PROT 8.7* 6.9 5.8*  ALBUMIN 4.5 4.0 3.2*   Recent Labs  Lab 02/27/21 2036 03/04/21 0415 03/05/21 0439  LIPASE 32 98* 115*   No results for input(s): AMMONIA in the last 168 hours. Coagulation Profile: No results for input(s): INR, PROTIME in the last 168 hours. CBC: Recent Labs  Lab 02/27/21 2036 03/04/21 0415 03/05/21 0439  WBC 13.5* 6.2 6.1  NEUTROABS  --  3.7  --   HGB 18.1* 14.0 12.5*  HCT 52.6* 40.1 37.1*  MCV 86.5 85.7 87.5  PLT 250 187 165   Cardiac Enzymes: No results for input(s): CKTOTAL, CKMB, CKMBINDEX, TROPONINI in the last 168 hours. BNP: Invalid input(s): POCBNP CBG: Recent Labs  Lab 03/04/21 1119 03/04/21 1704 03/04/21 2104 03/05/21 0711 03/05/21 1123  GLUCAP 294* 209* 180* 228* 123*   HbA1C: Recent Labs    03/04/21 0439 03/04/21 0800  HGBA1C 10.8* 10.6*   Urine analysis:    Component Value Date/Time   COLORURINE STRAW (A) 03/04/2021 0746   APPEARANCEUR CLEAR  03/04/2021 0746   LABSPEC 1.003 (L) 03/04/2021 0746   PHURINE 6.0 03/04/2021 0746   GLUCOSEU 150 (A) 03/04/2021 0746   HGBUR NEGATIVE 03/04/2021 0746   BILIRUBINUR NEGATIVE 03/04/2021 0746   KETONESUR NEGATIVE 03/04/2021 0746   PROTEINUR NEGATIVE 03/04/2021 0746   UROBILINOGEN 0.2 12/09/2011 1955   NITRITE NEGATIVE 03/04/2021 0746   LEUKOCYTESUR NEGATIVE 03/04/2021 0746   Sepsis Labs: @LABRCNTIP (procalcitonin:4,lacticidven:4) ) Recent Results (from the past 240 hour(s))  Culture, blood (routine x 2)     Status: None   Collection Time: 02/27/21 11:12 PM   Specimen: BLOOD  Result Value Ref Range Status   Specimen Description BLOOD RIGHT ANTECUBITAL  Final   Special Requests   Final    BOTTLES DRAWN AEROBIC AND ANAEROBIC Blood Culture results may not be optimal due to an  excessive volume of blood received in culture bottles   Culture   Final    NO GROWTH 5 DAYS Performed at [facility], [address] [place], [place] [address]    Report Status 03/04/2021 FINAL  Final  Culture, blood (routine x 2)     Status: None   Collection Time: 02/27/21 11:12 PM   Specimen: BLOOD  Result Value Ref Range Status   Specimen Description BLOOD LEFT ANTECUBITAL  Final   Special Requests   Final    BOTTLES DRAWN AEROBIC AND ANAEROBIC Blood Culture results may not be optimal due to an excessive volume of blood received in culture bottles   Culture   Final    NO GROWTH 5 DAYS Performed at [facility], [address] [place], [place] [address]    Report Status 03/04/2021 FINAL  Final  Resp Panel by RT-PCR (Flu A&B, Covid) Nasopharyngeal Swab     Status: None   Collection Time: 02/27/21 11:12 PM   Specimen: Nasopharyngeal Swab; Nasopharyngeal(NP) swabs in vial transport medium  Result Value Ref Range Status   SARS Coronavirus 2 by RT PCR NEGATIVE NEGATIVE Final    Comment: (NOTE) SARS-CoV-2 target nucleic acids are NOT DETECTED.  The SARS-CoV-2 RNA is generally  detectable in upper respiratory specimens during the acute phase of infection. The lowest concentration of SARS-CoV-2 viral copies this assay can detect is 138 copies/mL. A negative result does not preclude SARS-Cov-2 infection and should not be used as the sole basis for treatment or other patient management decisions. A negative result may occur with  improper specimen collection/handling, submission of specimen other than nasopharyngeal swab, presence of viral mutation(s) within the areas targeted by this assay, and inadequate number of viral copies(<138 copies/mL). A negative result must be combined with clinical observations, patient history, and epidemiological information. The expected result is Negative.  Fact Sheet for Patients:  EntrepreneurPulse.com.au  Fact Sheet for Healthcare Providers:  IncredibleEmployment.be  This test is no t yet approved or cleared by the [place] FDA and  has been authorized for detection and/or diagnosis of SARS-CoV-2 by FDA under an Emergency Use Authorization (EUA). This EUA will remain  in effect (meaning this test can be used) for the duration of the COVID-19 declaration under Section 564(b)(1) of the Act, 21 U.S.C.section 360bbb-3(b)(1), unless the authorization is terminated  or revoked sooner.       Influenza A by PCR NEGATIVE NEGATIVE Final   Influenza B by PCR NEGATIVE NEGATIVE Final    Comment: (NOTE) The Xpert Xpress SARS-CoV-2/FLU/RSV plus assay is intended as an aid in the diagnosis of influenza from Nasopharyngeal swab specimens and should not be used as a sole basis for treatment. Nasal washings and aspirates are unacceptable for Xpert Xpress SARS-CoV-2/FLU/RSV testing.  Fact Sheet for Patients: EntrepreneurPulse.com.au  Fact Sheet for Healthcare Providers: IncredibleEmployment.be  This test is not yet approved or cleared by the [place] FDA  and has been authorized for detection and/or diagnosis of SARS-CoV-2 by FDA under an Emergency Use Authorization (EUA). This EUA will remain in effect (meaning this test can be used) for the duration of the COVID-19 declaration under Section 564(b)(1) of the Act, 21 U.S.C. section 360bbb-3(b)(1), unless the authorization is terminated or revoked.  Performed at [facility], [address] [place], [place] [address]   Resp Panel by RT-PCR (Flu A&B, Covid) Nasopharyngeal Swab     Status: None   Collection Time: 03/04/21  5:37 AM   Specimen: Nasopharyngeal  Swab; Nasopharyngeal(NP) swabs in vial transport medium  Result Value Ref Range Status   SARS Coronavirus 2 by RT PCR NEGATIVE NEGATIVE Final    Comment: (NOTE) SARS-CoV-2 target nucleic acids are NOT DETECTED.  The SARS-CoV-2 RNA is generally detectable in upper respiratory specimens during the acute phase of infection. The lowest concentration of SARS-CoV-2 viral copies this assay can detect is 138 copies/mL. A negative result does not preclude SARS-Cov-2 infection and should not be used as the sole basis for treatment or other patient management decisions. A negative result may occur with  improper specimen collection/handling, submission of specimen other than nasopharyngeal swab, presence of viral mutation(s) within the areas targeted by this assay, and inadequate number of viral copies(<138 copies/mL). A negative result must be combined with clinical observations, patient history, and epidemiological information. The expected result is Negative.  Fact Sheet for Patients:  EntrepreneurPulse.com.au  Fact Sheet for Healthcare Providers:  IncredibleEmployment.be  This test is no t yet approved or cleared by the [place] FDA and  has been authorized for detection and/or diagnosis of SARS-CoV-2 by FDA under an Emergency Use Authorization (EUA). This EUA will remain  in effect  (meaning this test can be used) for the duration of the COVID-19 declaration under Section 564(b)(1) of the Act, 21 U.S.C.section 360bbb-3(b)(1), unless the authorization is terminated  or revoked sooner.       Influenza A by PCR NEGATIVE NEGATIVE Final   Influenza B by PCR NEGATIVE NEGATIVE Final    Comment: (NOTE) The Xpert Xpress SARS-CoV-2/FLU/RSV plus assay is intended as an aid in the diagnosis of influenza from Nasopharyngeal swab specimens and should not be used as a sole basis for treatment. Nasal washings and aspirates are unacceptable for Xpert Xpress SARS-CoV-2/FLU/RSV testing.  Fact Sheet for Patients: EntrepreneurPulse.com.au  Fact Sheet for Healthcare Providers: IncredibleEmployment.be  This test is not yet approved or cleared by the [place] FDA and has been authorized for detection and/or diagnosis of SARS-CoV-2 by FDA under an Emergency Use Authorization (EUA). This EUA will remain in effect (meaning this test can be used) for the duration of the COVID-19 declaration under Section 564(b)(1) of the Act, 21 U.S.C. section 360bbb-3(b)(1), unless the authorization is terminated or revoked.  Performed at [facility], [address], [place], [place] [address]   Urine Culture     Status: None   Collection Time: 03/04/21  7:46 AM   Specimen: Urine, Clean Catch  Result Value Ref Range Status   Specimen Description   Final    URINE, CLEAN CATCH Performed at [facility], [address], [place], [place] [address]    Special Requests   Final    NONE Performed at [facility], [address] [place], [place] [address]    Culture   Final    NO GROWTH Performed at [facility], [place] [address] [place], [place] [address]    Report Status 03/05/2021 FINAL  Final  Culture, blood (routine x 2)     Status: None (Preliminary result)   Collection Time: 03/04/21  7:53 AM   Specimen: Right Antecubital; Blood  Result Value  Ref Range Status   Specimen Description   Final    RIGHT ANTECUBITAL BOTTLES DRAWN AEROBIC AND ANAEROBIC   Special Requests Blood Culture adequate volume  Final   Culture   Final    NO GROWTH 1 DAY Performed at [facility], [address] [place], [place] [address]    Report Status PENDING  Incomplete  Culture, blood (routine x 2)     Status: None (Preliminary result)   Collection Time: 03/04/21  7:59 AM   Specimen: Left Antecubital; Blood  Result Value Ref Range Status   Specimen Description   Final    LEFT ANTECUBITAL BOTTLES DRAWN AEROBIC AND ANAEROBIC   Special Requests Blood Culture adequate volume  Final   Culture   Final    NO GROWTH 1 DAY Performed at [facility], [address] [place], [place] [address]    Report Status PENDING  Incomplete     Scheduled Meds:  [START ON 03/06/2021] COVID-19 mRNA bivalent vaccine (Moderna)  0.5 mL Intramuscular ONCE-1600   enoxaparin (LOVENOX) injection  40 mg Subcutaneous Daily   feeding supplement  1 Container Oral TID BM   insulin aspart  0-5 Units Subcutaneous QHS   insulin aspart  0-9 Units Subcutaneous TID WC   ondansetron (ZOFRAN) IV  4 mg Intravenous Q6H   Continuous Infusions:  0.9 % NaCl with KCl 20 mEq / L 125 mL/hr at 03/05/21 1235   promethazine (PHENERGAN) injection (IM or IVPB)      Procedures/Studies: CT ABDOMEN PELVIS WO CONTRAST  Result Date: 03/04/2021 CLINICAL DATA:  Nausea and vomiting. EXAM: CT ABDOMEN AND PELVIS WITHOUT CONTRAST TECHNIQUE: Multidetector CT imaging of the abdomen and pelvis was performed following the standard protocol without IV contrast. COMPARISON:  02/28/2021 FINDINGS: Lower chest: No acute abnormality. Hepatobiliary: No focal liver abnormality. Status post cholecystectomy. No bile duct dilatation. Pancreas: Unremarkable. No pancreatic ductal dilatation or surrounding inflammatory changes. Spleen: Normal in size without focal abnormality. Adrenals/Urinary Tract: Normal adrenal glands. No  nephrolithiasis, hydronephrosis, or mass identified. Urinary bladder is unremarkable. Stomach/Bowel: Stomach appears normal. The appendix is not confidently identified and may be surgically absent. No secondary signs of acute appendicitis identified there is no bowel wall thickening, inflammation, or distension. Vascular/Lymphatic: Aortic atherosclerosis. No enlarged abdominal or pelvic lymph nodes. Reproductive: Prostate is unremarkable. Other: No free fluid or fluid collections. No signs of pneumoperitoneum. Musculoskeletal: No acute or significant osseous findings. IMPRESSION: 1. No acute findings within the abdomen or pelvis. 2. Status post cholecystectomy. 3. Aortic Atherosclerosis (ICD10-I70.0). Electronically Signed   By: Kerby Moors M.D.   On: 03/04/2021 07:46   CT ABDOMEN PELVIS WO CONTRAST  Result Date: 02/28/2021 CLINICAL DATA:  Nausea and vomiting for 4 days. EXAM: CT ABDOMEN AND PELVIS WITHOUT CONTRAST TECHNIQUE: Multidetector CT imaging of the abdomen and pelvis was performed following the standard protocol without IV contrast. COMPARISON:  CT without contrast 10/09/2019, CT with contrast 12/09/2011 FINDINGS: Lower chest: No acute abnormality. Hepatobiliary: No focal abnormality of liver is seen without contrast. The gallbladder is absent with no biliary dilatation. Pancreas: Unremarkable. No pancreatic ductal dilatation or surrounding inflammatory changes. Spleen: Normal in size without focal abnormality. Adrenals/Urinary Tract: There is no adrenal mass. Mild chronic perinephric stranding is unchanged. No urinary stones or hydronephrosis are observed. No focal abnormality of unenhanced renal cortex. There is mild thickening of the bladder versus underdistention. Stomach/Bowel: No dilatation or wall thickening. Surgically absent appendix, as before. Scattered sigmoid diverticula without evidence of acute diverticulitis. Vascular/Lymphatic: Mild aortoiliac atherosclerosis without AAA. No  abdominopelvic adenopathy is seen. Reproductive: Mildly enlarged prostate 4.4 cm transverse. Other: No abdominal wall hernia or abnormality. No abdominopelvic ascites. Multiple pelvic phleboliths. Musculoskeletal: There are mild degenerative changes of the spine. A bone island is again noted in the posterior left ilium. IMPRESSION: 1. No acute noncontrast CT findings or interval changes. 2. Mild thickening of  the bladder versus underdistention. No inflammatory change. 3. Prostatomegaly. 4. Diverticulosis without evidence of diverticulitis. Electronically Signed   By: Telford Nab M.D.   On: 02/28/2021 02:00   DG Abdomen Acute W/Chest  Result Date: 03/04/2021 CLINICAL DATA:  Abdominal pain and vomiting. EXAM: DG ABDOMEN ACUTE WITH 1 VIEW CHEST COMPARISON:  02/28/2021 FINDINGS: There is no evidence of dilated bowel loops or free intraperitoneal air. Moderate stool burden noted. Gas is seen throughout the colon up to the rectum. No radiopaque calculi or other significant radiographic abnormality is seen. Heart size and mediastinal contours are within normal limits. Both lungs are clear. IMPRESSION: 1. Nonobstructive bowel gas pattern. 2. Moderate stool burden within the colon suggestive of constipation. Electronically Signed   By: Kerby Moors M.D.   On: 03/04/2021 05:57    Orson Eva, DO  [facility]  If 7PM-7AM, please contact night-coverage [email or web address] Password [email or web address] 03/05/2021, 1:36 PM   LOS: 1 day

## 2021-03-06 LAB — GASTROINTESTINAL PANEL BY PCR, STOOL (REPLACES STOOL CULTURE)

## 2021-03-06 LAB — COMPREHENSIVE METABOLIC PANEL
ALT: 10 U/L (ref 0–44)
AST: 14 U/L — ABNORMAL LOW (ref 15–41)
Albumin: 3.3 g/dL — ABNORMAL LOW (ref 3.5–5.0)
Alkaline Phosphatase: 86 U/L (ref 38–126)
Anion gap: 8 (ref 5–15)
BUN: <5 mg/dL — ABNORMAL LOW (ref 6–20)
CO2: 29 mmol/L (ref 22–32)
Calcium: 8.6 mg/dL — ABNORMAL LOW (ref 8.9–10.3)
Chloride: 99 mmol/L (ref 98–111)
Creatinine, Ser: 0.82 mg/dL (ref 0.61–1.24)
GFR, Estimated: >60 mL/min (ref >60)
Glucose, Bld: 174 mg/dL — ABNORMAL HIGH (ref 70–99)
Potassium: 3.5 mmol/L (ref 3.5–5.1)
Sodium: 136 mmol/L (ref 135–145)
Total Bilirubin: 0.5 mg/dL (ref 0.3–1.2)
Total Protein: 6.1 g/dL — ABNORMAL LOW (ref 6.5–8.1)

## 2021-03-06 LAB — LIPASE, BLOOD: Lipase: 32 U/L (ref 11–51)

## 2021-03-06 LAB — GLUCOSE, CAPILLARY
Glucose-Capillary: 187 mg/dL — ABNORMAL HIGH (ref 70–99)
Glucose-Capillary: 210 mg/dL — ABNORMAL HIGH (ref 70–99)
Glucose-Capillary: 157 mg/dL — ABNORMAL HIGH (ref 70–99)

## 2021-03-06 MED ORDER — AMLODIPINE BESYLATE 5 MG PO TABS
5.0000 mg | ORAL_TABLET | Freq: Every day | ORAL | Status: DC
  Administered 2021-03-06: 5 mg via ORAL
  Filled 2021-03-06: qty 1

## 2021-03-06 MED ORDER — LIVING WELL WITH DIABETES BOOK
Freq: Once | Status: AC
  Filled 2021-03-06: qty 1

## 2021-03-06 MED ORDER — PANTOPRAZOLE SODIUM 40 MG PO TBEC: 40.0000 mg | DELAYED_RELEASE_TABLET | Freq: Two times a day (BID) | ORAL | 1 refills | Status: DC

## 2021-03-06 MED ORDER — AMLODIPINE BESYLATE 5 MG PO TABS: 5.0000 mg | ORAL_TABLET | Freq: Every day | ORAL | 1 refills | Status: DC

## 2021-03-06 MED ORDER — GLIPIZIDE ER 5 MG PO TB24: 5.0000 mg | ORAL_TABLET | Freq: Every day | ORAL | 1 refills | Status: DC

## 2021-03-06 MED ORDER — METFORMIN HCL 500 MG PO TABS: 500.0000 mg | ORAL_TABLET | Freq: Two times a day (BID) | ORAL | 2 refills | Status: DC

## 2021-03-06 MED ORDER — KETOROLAC TROMETHAMINE 30 MG/ML IJ SOLN
30.0000 mg | Freq: Once | INTRAMUSCULAR | Status: AC
  Administered 2021-03-06: 30 mg via INTRAVENOUS
  Filled 2021-03-06: qty 1

## 2021-03-06 NOTE — Progress Notes (Signed)
Inpatient Diabetes Program Recommendations  AACE/ADA: New Consensus Statement on Inpatient Glycemic Control (2015)  Target Ranges:  Prepandial:   less than 140 mg/dL      Peak postprandial:   less than 180 mg/dL (1-2 hours)      Critically ill patients:  140 - 180 mg/dL   Lab Results  Component Value Date   GLUCAP 210 (H) 03/06/2021   HGBA1C 10.6 (H) 03/04/2021    Review of Glycemic Control  Latest Reference Range & Units 03/05/21 07:11 03/05/21 11:23 03/05/21 16:23 03/05/21 20:26 03/06/21 07:24 03/06/21 11:14  Glucose-Capillary 70 - 99 mg/dL 350 (H) 093 (H) 818 (H) 163 (H) 187 (H) 210 (H)  (H): Data is abnormally high Diabetes history: DM2 Outpatient Diabetes medications: None Current orders for Inpatient glycemic control: Novolog correction 0-9 units tid, 0-5 units hs  Inpatient Diabetes Program Recommendations:   Spoke with patient via phone (DM coordinator @ [facility]). Patient states he has severe diarrhea when taking the Metformin and "can't do nothing". States he no longer takes Actos but no full answer as to why he doesn't. Explained risks with increased CBGs and A1c. Patient states understanding. Reviewed benefits of glycemic control including neuropathy symptoms. Discussed A1c of 10.6 (average blood glucose of 263 over the past 2-3 months). I explained what an A1C is, basic pathophysiology of DM Type 2, basic home care, basic diabetes diet nutrition principles, importance of checking CBGs and maintaining good CBG control to prevent [place]-term and short-term complications. Reviewed signs and symptoms of hyperglycemia and hypoglycemia and how to treat hypoglycemia at home. Also reviewed blood sugar goals at home. Ordered Living Well With diabetes booklet for review.  Thank you, Jordan Osborn. Nayda Riesen, RN, MSN, CDE  Diabetes Coordinator Inpatient Glycemic Control Team Team Pager [phone] (8am-5pm) 03/06/2021 12:34 PM

## 2021-03-06 NOTE — Progress Notes (Addendum)
Pt gave this nurse permission to call niece Tiffeny and inform her of discharge orders, she states she will be on her way to pick him up within an hour. Discharge teaching given and pt has no further questions at this time.

## 2021-03-06 NOTE — TOC Progression Note (Signed)
Transition of Care [facility]) - Progression Note    Patient Details  Name: Jordan Osborn MRN: [id number] Date of Birth: [birth year]  Transition of Care [facility]) CM/SW Contact  Karn Cassis, [place] Phone Number: 03/06/2021, 10:15 AM  Clinical Narrative:    Transition of Care (TOC) Screening Note   Patient Details  Name: Jordan Osborn Date of Birth: 01/27/1971   Transition of Care [facility]) CM/SW Contact:    Karn Cassis, LCSW Phone Number: 03/06/2021, 10:15 AM    Transition of Care Department [facility]) has reviewed patient and no TOC needs have been identified at this time. We will continue to monitor patient advancement through interdisciplinary progression rounds. If new patient transition needs arise, please place a TOC consult.        Barriers to Discharge: Continued Medical Work up  Expected Discharge Plan and Services                                                 Social Determinants of Health (SDOH) Interventions    Readmission Risk Interventions No flowsheet data found.

## 2021-03-06 NOTE — Tx Team (Signed)
Patient left facility via car with niece. No complaints of pain or distress. Patient IV removed. Vital signs stable.

## 2021-03-06 NOTE — Discharge Summary (Signed)
Physician Discharge Summary  Jordan Osborn UVO:[id number] DOB: 03-May-1970 DOA: 03/04/2021  PCP: Patient, No Pcp Per (Inactive)  Admit date: 03/04/2021 Discharge date: 03/06/2021  Admitted From: home Disposition:  Home   Recommendations for Outpatient Follow-up:  Follow up with PCP in 1-2 weeks Please obtain BMP/CBC in one week     Discharge Condition: Stable CODE STATUS:FULL Diet recommendation: Heart Healthy / Carb Modified    Brief/Interim Summary:  51 y.o. male with medical history of hypertension diabetes mellitus presenting with at least 2-week history of nausea, vomiting, diarrhea, abdominal pain.  He is a difficult historian.  He is not able to quantify how often he vomits daily, nor can he quantify how any bowel movements he has on a daily basis.  However he states that his stool has been watery in nature without any blood or black stool.  He has not had any blood in the emesis.  He denies any fevers, chills, chest pain, shortness of breath, coughing, hemoptysis.  He denies any new medications except for Zofran.  He states that he is not taking any prescription medications at this time.  He stated that his previous physicians had taken him off his antihypertensive and diabetic medications.  He is not able to tell me with a degree of certainty how Sendejo has been off all his prescription medications.  He denies any tobacco use, alcohol use, or illegal drug use.  He states that he used to drink beer daily but has not drank any alcohol in over a year. He visited the ED at [facility] on 02/27/2021 with similar complaints.  He was treated symptomatically and released when his CT abdomen was reassuring.  Because of persistent nausea, vomiting, diarrhea, abdominal pain, he presented to get further evaluation and treatment.  He states that he has not been able to tolerate any p.o. intake. ED In the emergency department, the patient was afebrile and hemodynamically stable with  oxygen saturation 100% room air.  WBC 6.2, hemoglobin 14.0, platelets 187,000.  AST 22, ALT 13, alk phosphatase 104, total bilirubin 0.4, lipase 98.  Sodium 134, potassium 3.0, bicarbonate 31, serum creatinine 1.27.  The patient was given Zofran, Phenergan, and 3 L of normal saline. Discharge Diagnoses:   Intractable nausea, vomiting, diarrhea -Etiology unclear, work-up in progress -due to acute gastroenteritis with exacerbation of presumptive gastroparesis -03/04/21 CT abdomen pelvis--no acute findings, particularly no pancreatic stranding -Zofran around-the-clock -Continue IV fluids -UA--no pyuria -Urine drug screen--neg -Protonix twice daily -Cdiff neg -GI pathogen panel pending -overall diarrhea resolved -diet advanced to carb modified which he tolerated   Elevated lipase -Unclear clinical significance presently -03/04/2021 CT abdomen--no pancreatic stranding; no biliary ductal dilatation -Lipase 98>>115>>32 -triglycerides 93 -03/06/21--abd pain resolved   Lactic acidosis -Lactic acid 5.5>>1.9 -Continue IV fluids -Continued to trend -Check PCT<0.10 -Blood cultures x2 sets -UA--no pyuria   Diarrhea -C. difficile assay--neg -Stool pathogen panel--pending -diarrhea resolved   Diabetes mellitus type 2, uncontrolled with hyperglycemia -Hemoglobin A1c--10.6 -NovoLog sliding scale -He's a bit hesitant to start insulin -restart metformin -add glipizde   Essential hypertension -Patient was not on any outpatient medications   Hypokalemia/Hypomagnesemia -Repleted  Discharge Instructions   Allergies as of 03/06/2021       Reactions   Benadryl [diphenhydramine Hcl]    Morphine And Related    Omnipaque [iohexol] Hives, Itching   Patient experienced hive and severe itching whole body after Omnipaque injection.  Solumedrol and pepsed administered in ED after exam  Medication List     STOP taking these medications    ciprofloxacin 500 MG tablet Commonly  known as: Cipro   labetalol 100 MG tablet Commonly known as: NORMODYNE   losartan 50 MG tablet Commonly known as: COZAAR   meloxicam 7.5 MG tablet Commonly known as: MOBIC   ondansetron 4 MG tablet Commonly known as: Zofran   pioglitazone 15 MG tablet Commonly known as: ACTOS   pregabalin 50 MG capsule Commonly known as: LYRICA       TAKE these medications    amLODipine 5 MG tablet Commonly known as: NORVASC Take 1 tablet (5 mg total) by mouth daily.   gabapentin 300 MG capsule Commonly known as: NEURONTIN Take 300 mg by mouth 2 (two) times daily.   glipiZIDE 5 MG 24 hr tablet Commonly known as: GLUCOTROL XL Take 1 tablet (5 mg total) by mouth daily with breakfast.   glucose monitoring kit monitoring kit 1 each by Does not apply route as needed for other.   metFORMIN 500 MG tablet Commonly known as: GLUCOPHAGE Take 1 tablet (500 mg total) by mouth 2 (two) times daily with a meal.   nitroGLYCERIN 0.4 MG SL tablet Commonly known as: NITROSTAT SMARTSIG:1 Sublingual Daily   pantoprazole 40 MG tablet Commonly known as: PROTONIX Take 1 tablet (40 mg total) by mouth 2 (two) times daily.        Allergies  Allergen Reactions   Benadryl [Diphenhydramine Hcl]    Morphine And Related    Omnipaque [Iohexol] Hives and Itching    Patient experienced hive and severe itching whole body after Omnipaque injection.  Solumedrol and pepsed administered in ED after exam    Consultations: none   Procedures/Studies: CT ABDOMEN PELVIS WO CONTRAST  Result Date: 03/04/2021 CLINICAL DATA:  Nausea and vomiting. EXAM: CT ABDOMEN AND PELVIS WITHOUT CONTRAST TECHNIQUE: Multidetector CT imaging of the abdomen and pelvis was performed following the standard protocol without IV contrast. COMPARISON:  02/28/2021 FINDINGS: Lower chest: No acute abnormality. Hepatobiliary: No focal liver abnormality. Status post cholecystectomy. No bile duct dilatation. Pancreas: Unremarkable. No  pancreatic ductal dilatation or surrounding inflammatory changes. Spleen: Normal in size without focal abnormality. Adrenals/Urinary Tract: Normal adrenal glands. No nephrolithiasis, hydronephrosis, or mass identified. Urinary bladder is unremarkable. Stomach/Bowel: Stomach appears normal. The appendix is not confidently identified and may be surgically absent. No secondary signs of acute appendicitis identified there is no bowel wall thickening, inflammation, or distension. Vascular/Lymphatic: Aortic atherosclerosis. No enlarged abdominal or pelvic lymph nodes. Reproductive: Prostate is unremarkable. Other: No free fluid or fluid collections. No signs of pneumoperitoneum. Musculoskeletal: No acute or significant osseous findings. IMPRESSION: 1. No acute findings within the abdomen or pelvis. 2. Status post cholecystectomy. 3. Aortic Atherosclerosis (ICD10-I70.0). Electronically Signed   By: Kerby Moors M.D.   On: 03/04/2021 07:46   CT ABDOMEN PELVIS WO CONTRAST  Result Date: 02/28/2021 CLINICAL DATA:  Nausea and vomiting for 4 days. EXAM: CT ABDOMEN AND PELVIS WITHOUT CONTRAST TECHNIQUE: Multidetector CT imaging of the abdomen and pelvis was performed following the standard protocol without IV contrast. COMPARISON:  CT without contrast 10/09/2019, CT with contrast 12/09/2011 FINDINGS: Lower chest: No acute abnormality. Hepatobiliary: No focal abnormality of liver is seen without contrast. The gallbladder is absent with no biliary dilatation. Pancreas: Unremarkable. No pancreatic ductal dilatation or surrounding inflammatory changes. Spleen: Normal in size without focal abnormality. Adrenals/Urinary Tract: There is no adrenal mass. Mild chronic perinephric stranding is unchanged. No urinary stones or hydronephrosis are observed. No  focal abnormality of unenhanced renal cortex. There is mild thickening of the bladder versus underdistention. Stomach/Bowel: No dilatation or wall thickening. Surgically absent  appendix, as before. Scattered sigmoid diverticula without evidence of acute diverticulitis. Vascular/Lymphatic: Mild aortoiliac atherosclerosis without AAA. No abdominopelvic adenopathy is seen. Reproductive: Mildly enlarged prostate 4.4 cm transverse. Other: No abdominal wall hernia or abnormality. No abdominopelvic ascites. Multiple pelvic phleboliths. Musculoskeletal: There are mild degenerative changes of the spine. A bone island is again noted in the posterior left ilium. IMPRESSION: 1. No acute noncontrast CT findings or interval changes. 2. Mild thickening of the bladder versus underdistention. No inflammatory change. 3. Prostatomegaly. 4. Diverticulosis without evidence of diverticulitis. Electronically Signed   By: Telford Nab M.D.   On: 02/28/2021 02:00   DG Abdomen Acute W/Chest  Result Date: 03/04/2021 CLINICAL DATA:  Abdominal pain and vomiting. EXAM: DG ABDOMEN ACUTE WITH 1 VIEW CHEST COMPARISON:  02/28/2021 FINDINGS: There is no evidence of dilated bowel loops or free intraperitoneal air. Moderate stool burden noted. Gas is seen throughout the colon up to the rectum. No radiopaque calculi or other significant radiographic abnormality is seen. Heart size and mediastinal contours are within normal limits. Both lungs are clear. IMPRESSION: 1. Nonobstructive bowel gas pattern. 2. Moderate stool burden within the colon suggestive of constipation. Electronically Signed   By: Kerby Moors M.D.   On: 03/04/2021 05:57        Discharge Exam: Vitals:   03/05/21 2023 03/06/21 0543  BP: (!) 155/93 (!) 157/89  Pulse: 69 83  Resp: 16 18  Temp: 98 F (36.7 C) 98 F (36.7 C)  SpO2: 100% 97%   Vitals:   03/04/21 1408 03/04/21 2102 03/05/21 2023 03/06/21 0543  BP: 140/78 (!) 153/85 (!) 155/93 (!) 157/89  Pulse: 92 75 69 83  Resp: 19 18 16 18   Temp: 98.7 F (37.1 C) 97.8 F (36.6 C) 98 F (36.7 C) 98 F (36.7 C)  TempSrc: Oral Oral    SpO2: 100% 100% 100% 97%  Weight:      Height:         General: Pt is alert, awake, not in acute distress Cardiovascular: RRR, S1/S2 +, no rubs, no gallops Respiratory: CTA bilaterally, no wheezing, no rhonchi Abdominal: Soft, NT, ND, bowel sounds + Extremities: no edema, no cyanosis   The results of significant diagnostics from this hospitalization (including imaging, microbiology, ancillary and laboratory) are listed below for reference.    Significant Diagnostic Studies: CT ABDOMEN PELVIS WO CONTRAST  Result Date: 03/04/2021 CLINICAL DATA:  Nausea and vomiting. EXAM: CT ABDOMEN AND PELVIS WITHOUT CONTRAST TECHNIQUE: Multidetector CT imaging of the abdomen and pelvis was performed following the standard protocol without IV contrast. COMPARISON:  02/28/2021 FINDINGS: Lower chest: No acute abnormality. Hepatobiliary: No focal liver abnormality. Status post cholecystectomy. No bile duct dilatation. Pancreas: Unremarkable. No pancreatic ductal dilatation or surrounding inflammatory changes. Spleen: Normal in size without focal abnormality. Adrenals/Urinary Tract: Normal adrenal glands. No nephrolithiasis, hydronephrosis, or mass identified. Urinary bladder is unremarkable. Stomach/Bowel: Stomach appears normal. The appendix is not confidently identified and may be surgically absent. No secondary signs of acute appendicitis identified there is no bowel wall thickening, inflammation, or distension. Vascular/Lymphatic: Aortic atherosclerosis. No enlarged abdominal or pelvic lymph nodes. Reproductive: Prostate is unremarkable. Other: No free fluid or fluid collections. No signs of pneumoperitoneum. Musculoskeletal: No acute or significant osseous findings. IMPRESSION: 1. No acute findings within the abdomen or pelvis. 2. Status post cholecystectomy. 3. Aortic Atherosclerosis (ICD10-I70.0). Electronically Signed  By: Kerby Moors M.D.   On: 03/04/2021 07:46   CT ABDOMEN PELVIS WO CONTRAST  Result Date: 02/28/2021 CLINICAL DATA:  Nausea and  vomiting for 4 days. EXAM: CT ABDOMEN AND PELVIS WITHOUT CONTRAST TECHNIQUE: Multidetector CT imaging of the abdomen and pelvis was performed following the standard protocol without IV contrast. COMPARISON:  CT without contrast 10/09/2019, CT with contrast 12/09/2011 FINDINGS: Lower chest: No acute abnormality. Hepatobiliary: No focal abnormality of liver is seen without contrast. The gallbladder is absent with no biliary dilatation. Pancreas: Unremarkable. No pancreatic ductal dilatation or surrounding inflammatory changes. Spleen: Normal in size without focal abnormality. Adrenals/Urinary Tract: There is no adrenal mass. Mild chronic perinephric stranding is unchanged. No urinary stones or hydronephrosis are observed. No focal abnormality of unenhanced renal cortex. There is mild thickening of the bladder versus underdistention. Stomach/Bowel: No dilatation or wall thickening. Surgically absent appendix, as before. Scattered sigmoid diverticula without evidence of acute diverticulitis. Vascular/Lymphatic: Mild aortoiliac atherosclerosis without AAA. No abdominopelvic adenopathy is seen. Reproductive: Mildly enlarged prostate 4.4 cm transverse. Other: No abdominal wall hernia or abnormality. No abdominopelvic ascites. Multiple pelvic phleboliths. Musculoskeletal: There are mild degenerative changes of the spine. A bone island is again noted in the posterior left ilium. IMPRESSION: 1. No acute noncontrast CT findings or interval changes. 2. Mild thickening of the bladder versus underdistention. No inflammatory change. 3. Prostatomegaly. 4. Diverticulosis without evidence of diverticulitis. Electronically Signed   By: Telford Nab M.D.   On: 02/28/2021 02:00   DG Abdomen Acute W/Chest  Result Date: 03/04/2021 CLINICAL DATA:  Abdominal pain and vomiting. EXAM: DG ABDOMEN ACUTE WITH 1 VIEW CHEST COMPARISON:  02/28/2021 FINDINGS: There is no evidence of dilated bowel loops or free intraperitoneal air. Moderate  stool burden noted. Gas is seen throughout the colon up to the rectum. No radiopaque calculi or other significant radiographic abnormality is seen. Heart size and mediastinal contours are within normal limits. Both lungs are clear. IMPRESSION: 1. Nonobstructive bowel gas pattern. 2. Moderate stool burden within the colon suggestive of constipation. Electronically Signed   By: Kerby Moors M.D.   On: 03/04/2021 05:57    Microbiology: Recent Results (from the past 240 hour(s))  Culture, blood (routine x 2)     Status: None   Collection Time: 02/27/21 11:12 PM   Specimen: BLOOD  Result Value Ref Range Status   Specimen Description BLOOD RIGHT ANTECUBITAL  Final   Special Requests   Final    BOTTLES DRAWN AEROBIC AND ANAEROBIC Blood Culture results may not be optimal due to an excessive volume of blood received in culture bottles   Culture   Final    NO GROWTH 5 DAYS Performed at [facility], [address] [place], [place] [address]    Report Status 03/04/2021 FINAL  Final  Culture, blood (routine x 2)     Status: None   Collection Time: 02/27/21 11:12 PM   Specimen: BLOOD  Result Value Ref Range Status   Specimen Description BLOOD LEFT ANTECUBITAL  Final   Special Requests   Final    BOTTLES DRAWN AEROBIC AND ANAEROBIC Blood Culture results may not be optimal due to an excessive volume of blood received in culture bottles   Culture   Final    NO GROWTH 5 DAYS Performed at [facility], [address], [place], [place] [address]    Report Status 03/04/2021 FINAL  Final  Resp Panel by RT-PCR (Flu A&B, Covid) Nasopharyngeal Swab  Status: None   Collection Time: 02/27/21 11:12 PM   Specimen: Nasopharyngeal Swab; Nasopharyngeal(NP) swabs in vial transport medium  Result Value Ref Range Status   SARS Coronavirus 2 by RT PCR NEGATIVE NEGATIVE Final    Comment: (NOTE) SARS-CoV-2 target nucleic acids are NOT DETECTED.  The SARS-CoV-2 RNA is generally detectable in  upper respiratory specimens during the acute phase of infection. The lowest concentration of SARS-CoV-2 viral copies this assay can detect is 138 copies/mL. A negative result does not preclude SARS-Cov-2 infection and should not be used as the sole basis for treatment or other patient management decisions. A negative result may occur with  improper specimen collection/handling, submission of specimen other than nasopharyngeal swab, presence of viral mutation(s) within the areas targeted by this assay, and inadequate number of viral copies(<138 copies/mL). A negative result must be combined with clinical observations, patient history, and epidemiological information. The expected result is Negative.  Fact Sheet for Patients:  EntrepreneurPulse.com.au  Fact Sheet for Healthcare Providers:  IncredibleEmployment.be  This test is no t yet approved or cleared by the [place] FDA and  has been authorized for detection and/or diagnosis of SARS-CoV-2 by FDA under an Emergency Use Authorization (EUA). This EUA will remain  in effect (meaning this test can be used) for the duration of the COVID-19 declaration under Section 564(b)(1) of the Act, 21 U.S.C.section 360bbb-3(b)(1), unless the authorization is terminated  or revoked sooner.       Influenza A by PCR NEGATIVE NEGATIVE Final   Influenza B by PCR NEGATIVE NEGATIVE Final    Comment: (NOTE) The Xpert Xpress SARS-CoV-2/FLU/RSV plus assay is intended as an aid in the diagnosis of influenza from Nasopharyngeal swab specimens and should not be used as a sole basis for treatment. Nasal washings and aspirates are unacceptable for Xpert Xpress SARS-CoV-2/FLU/RSV testing.  Fact Sheet for Patients: EntrepreneurPulse.com.au  Fact Sheet for Healthcare Providers: IncredibleEmployment.be  This test is not yet approved or cleared by the [place] FDA and has been  authorized for detection and/or diagnosis of SARS-CoV-2 by FDA under an Emergency Use Authorization (EUA). This EUA will remain in effect (meaning this test can be used) for the duration of the COVID-19 declaration under Section 564(b)(1) of the Act, 21 U.S.C. section 360bbb-3(b)(1), unless the authorization is terminated or revoked.  Performed at [facility], [address] [place], [place] [address]   Resp Panel by RT-PCR (Flu A&B, Covid) Nasopharyngeal Swab     Status: None   Collection Time: 03/04/21  5:37 AM   Specimen: Nasopharyngeal Swab; Nasopharyngeal(NP) swabs in vial transport medium  Result Value Ref Range Status   SARS Coronavirus 2 by RT PCR NEGATIVE NEGATIVE Final    Comment: (NOTE) SARS-CoV-2 target nucleic acids are NOT DETECTED.  The SARS-CoV-2 RNA is generally detectable in upper respiratory specimens during the acute phase of infection. The lowest concentration of SARS-CoV-2 viral copies this assay can detect is 138 copies/mL. A negative result does not preclude SARS-Cov-2 infection and should not be used as the sole basis for treatment or other patient management decisions. A negative result may occur with  improper specimen collection/handling, submission of specimen other than nasopharyngeal swab, presence of viral mutation(s) within the areas targeted by this assay, and inadequate number of viral copies(<138 copies/mL). A negative result must be combined with clinical observations, patient history, and epidemiological information. The expected result is Negative.  Fact Sheet for Patients:  EntrepreneurPulse.com.au  Fact Sheet for Healthcare Providers:  IncredibleEmployment.be  This test is no t yet approved or cleared by the [place] and  has been authorized for detection and/or diagnosis of SARS-CoV-2 by FDA under an Emergency Use Authorization (EUA). This EUA will remain  in effect (meaning this  test can be used) for the duration of the COVID-19 declaration under Section 564(b)(1) of the Act, 21 U.S.C.section 360bbb-3(b)(1), unless the authorization is terminated  or revoked sooner.       Influenza A by PCR NEGATIVE NEGATIVE Final   Influenza B by PCR NEGATIVE NEGATIVE Final    Comment: (NOTE) The Xpert Xpress SARS-CoV-2/FLU/RSV plus assay is intended as an aid in the diagnosis of influenza from Nasopharyngeal swab specimens and should not be used as a sole basis for treatment. Nasal washings and aspirates are unacceptable for Xpert Xpress SARS-CoV-2/FLU/RSV testing.  Fact Sheet for Patients: EntrepreneurPulse.com.au  Fact Sheet for Healthcare Providers: IncredibleEmployment.be  This test is not yet approved or cleared by the [place] FDA and has been authorized for detection and/or diagnosis of SARS-CoV-2 by FDA under an Emergency Use Authorization (EUA). This EUA will remain in effect (meaning this test can be used) for the duration of the COVID-19 declaration under Section 564(b)(1) of the Act, 21 U.S.C. section 360bbb-3(b)(1), unless the authorization is terminated or revoked.  Performed at [facility], [address] [place], [place] [address]   Urine Culture     Status: None   Collection Time: 03/04/21  7:46 AM   Specimen: Urine, Clean Catch  Result Value Ref Range Status   Specimen Description   Final    URINE, CLEAN CATCH Performed at [facility], [address] [place], [place] [address]    Special Requests   Final    NONE Performed at [facility], [address], [place], [place] [address]    Culture   Final    NO GROWTH Performed at [facility], [place] [address], [place], [place] [address]    Report Status 03/05/2021 FINAL  Final  Culture, blood (routine x 2)     Status: None (Preliminary result)   Collection Time: 03/04/21  7:53 AM   Specimen: Right Antecubital; Blood  Result Value Ref Range Status    Specimen Description   Final    RIGHT ANTECUBITAL BOTTLES DRAWN AEROBIC AND ANAEROBIC   Special Requests Blood Culture adequate volume  Final   Culture   Final    NO GROWTH 2 DAYS Performed at [facility], [address], [place], [place] [address]    Report Status PENDING  Incomplete  Culture, blood (routine x 2)     Status: None (Preliminary result)   Collection Time: 03/04/21  7:59 AM   Specimen: Left Antecubital; Blood  Result Value Ref Range Status   Specimen Description   Final    LEFT ANTECUBITAL BOTTLES DRAWN AEROBIC AND ANAEROBIC   Special Requests Blood Culture adequate volume  Final   Culture   Final    NO GROWTH 2 DAYS Performed at [facility], [address] [place], [place] [address]    Report Status PENDING  Incomplete  C Difficile Quick Screen w PCR reflex     Status: None   Collection Time: 03/05/21  4:22 PM   Specimen: STOOL  Result Value Ref Range Status   C Diff antigen NEGATIVE NEGATIVE Final   C Diff toxin NEGATIVE NEGATIVE Final   C Diff interpretation No C. difficile detected.  Final    Comment: Performed at [facility],  [address] [place], [place] [address]     Labs: Basic Metabolic Panel: Recent Labs  Lab 02/27/21 2312 02/28/21 0056 03/04/21 0415 03/04/21 0752 03/05/21 0439 03/06/21 0458  NA 133* 132* 134*  --  139 136  K 4.4 4.4 3.0*  --  3.3* 3.5  CL 100 100 90*  --  102 99  CO2 17* 19* 31  --  28 29  GLUCOSE 232* 212* 288*  --  195* 174*  BUN 20 20 18   --  5* <5*  CREATININE 1.54* 1.53* 1.07  --  0.85 0.82  CALCIUM 9.3 8.8* 9.3  --  8.3* 8.6*  MG  --   --   --  1.6* 2.3  --    Liver Function Tests: Recent Labs  Lab 02/27/21 2036 03/04/21 0415 03/05/21 0439 03/06/21 0458  AST 26 22 15  14*  ALT 21 13 10 10   ALKPHOS 128* 104 78 86  BILITOT 2.0* 0.4 0.5 0.5  PROT 8.7* 6.9 5.8* 6.1*  ALBUMIN 4.5 4.0 3.2* 3.3*   Recent Labs  Lab 02/27/21 2036 03/04/21 0415 03/05/21 0439 03/06/21 0458  LIPASE 32 98* 115* 32   No  results for input(s): AMMONIA in the last 168 hours. CBC: Recent Labs  Lab 02/27/21 2036 03/04/21 0415 03/05/21 0439  WBC 13.5* 6.2 6.1  NEUTROABS  --  3.7  --   HGB 18.1* 14.0 12.5*  HCT 52.6* 40.1 37.1*  MCV 86.5 85.7 87.5  PLT 250 187 165   Cardiac Enzymes: No results for input(s): CKTOTAL, CKMB, CKMBINDEX, TROPONINI in the last 168 hours. BNP: Invalid input(s): POCBNP CBG: Recent Labs  Lab 03/05/21 1123 03/05/21 1623 03/05/21 2026 03/06/21 0724 03/06/21 1114  GLUCAP 123* 120* 163* 187* 210*    Time coordinating discharge:  36 minutes  Signed:  Orson Eva, DO [facility] Pager: [phone] 03/06/2021, 11:31 AM

## 2021-03-09 LAB — CULTURE, BLOOD (ROUTINE X 2)
Culture: NO GROWTH
Culture: NO GROWTH
Special Requests: ADEQUATE
Special Requests: ADEQUATE

## 2021-05-18 ENCOUNTER — Emergency Department (HOSPITAL_COMMUNITY)
Admission: EM | Admit: 2021-05-18 | Discharge: 2021-05-18 | Disposition: A | Discharge status: Home / Self Care | Payer: Medicare Other | Attending: Emergency Medicine | Admitting: Emergency Medicine

## 2021-05-18 ENCOUNTER — Encounter (HOSPITAL_COMMUNITY): Payer: Self-pay

## 2021-05-18 ENCOUNTER — Other Ambulatory Visit: Payer: Self-pay

## 2021-05-18 ENCOUNTER — Emergency Department (HOSPITAL_COMMUNITY): Payer: Medicare Other

## 2021-05-18 DIAGNOSIS — E1140 Type 2 diabetes mellitus with diabetic neuropathy, unspecified: Secondary | ICD-10-CM | POA: Insufficient documentation

## 2021-05-18 DIAGNOSIS — I10 Essential (primary) hypertension: Secondary | ICD-10-CM | POA: Insufficient documentation

## 2021-05-18 DIAGNOSIS — E1165 Type 2 diabetes mellitus with hyperglycemia: Secondary | ICD-10-CM | POA: Diagnosis present

## 2021-05-18 DIAGNOSIS — Z7984 Long term (current) use of oral hypoglycemic drugs: Secondary | ICD-10-CM | POA: Diagnosis not present

## 2021-05-18 DIAGNOSIS — Z79899 Other long term (current) drug therapy: Secondary | ICD-10-CM | POA: Diagnosis not present

## 2021-05-18 LAB — URINALYSIS, ROUTINE W REFLEX MICROSCOPIC
Bacteria, UA: NONE SEEN
Bilirubin Urine: NEGATIVE
Glucose, UA: >=500 mg/dL — AB
Ketones, ur: 5 mg/dL — AB
Leukocytes,Ua: NEGATIVE
Nitrite: NEGATIVE
Protein, ur: 30 mg/dL — AB
Specific Gravity, Urine: 1.028 (ref 1.005–1.030)
pH: 6 (ref 5.0–8.0)

## 2021-05-18 LAB — CBG MONITORING, ED
Glucose-Capillary: 557 mg/dL (ref 70–99)
Glucose-Capillary: 320 mg/dL — ABNORMAL HIGH (ref 70–99)

## 2021-05-18 LAB — BASIC METABOLIC PANEL
Anion gap: 10 (ref 5–15)
BUN: 18 mg/dL (ref 6–20)
CO2: 28 mmol/L (ref 22–32)
Calcium: 9.3 mg/dL (ref 8.9–10.3)
Chloride: 92 mmol/L — ABNORMAL LOW (ref 98–111)
Creatinine, Ser: 1.27 mg/dL — ABNORMAL HIGH (ref 0.61–1.24)
GFR, Estimated: >60 mL/min (ref >60)
Glucose, Bld: 563 mg/dL (ref 70–99)
Potassium: 4.3 mmol/L (ref 3.5–5.1)
Sodium: 130 mmol/L — ABNORMAL LOW (ref 135–145)

## 2021-05-18 LAB — CBC
HCT: 47.6 % (ref 39.0–52.0)
Hemoglobin: 15.6 g/dL (ref 13.0–17.0)
MCH: 28.8 pg (ref 26.0–34.0)
MCHC: 32.8 g/dL (ref 30.0–36.0)
MCV: 87.8 fL (ref 80.0–100.0)
Platelets: 219 10*3/uL (ref 150–400)
RBC: 5.42 MIL/uL (ref 4.22–5.81)
RDW: 12.7 % (ref 11.5–15.5)
WBC: 8.2 10*3/uL (ref 4.0–10.5)
nRBC: 0 % (ref 0.0–0.2)

## 2021-05-18 LAB — TROPONIN I (HIGH SENSITIVITY)
Troponin I (High Sensitivity): 7 ng/L (ref <18)
Troponin I (High Sensitivity): 6 ng/L (ref <18)

## 2021-05-18 MED ORDER — INSULIN ASPART 100 UNIT/ML IV SOLN
10.0000 [IU] | Freq: Once | INTRAVENOUS | Status: AC
  Administered 2021-05-18: 10 [IU] via INTRAVENOUS

## 2021-05-18 MED ORDER — SODIUM CHLORIDE 0.9 % IV BOLUS
1000.0000 mL | Freq: Once | INTRAVENOUS | Status: AC
  Administered 2021-05-18: 1000 mL via INTRAVENOUS

## 2021-05-18 MED ORDER — ACETAMINOPHEN 325 MG PO TABS
650.0000 mg | ORAL_TABLET | Freq: Once | ORAL | Status: AC
Start: 2021-05-18 — End: 2021-05-18
  Administered 2021-05-18: 650 mg via ORAL
  Filled 2021-05-18: qty 2

## 2021-05-18 NOTE — ED Notes (Signed)
Date and time results received: 05/18/21 1430 ? ? ?Test: Glucose ?Critical Value: 563 ? ?Name of Provider Notified: EDP ? ?Orders Received? Or Actions Taken?: notified ?

## 2021-05-18 NOTE — ED Notes (Signed)
Pt given a water per MD approval. ?

## 2021-05-18 NOTE — Discharge Instructions (Addendum)
Your testing shows that you have uncontrolled blood sugar, we have given you some medications here which have helped but you will need to take your medications exactly as prescribed at home.  You will need to follow-up with your family doctor regarding your peripheral neuropathy which is the pain in your feet.  The better control you have over your blood sugar, the less pain you will have Ferreras-term. ? ?You should see your doctor at their office in [facility] / [place] within the next 48 hours for a follow-up, they will need to get you hooked up with a Becka-term better control of your blood sugar and potentially a machine to measure your blood sugar daily. ? ?Return to the ER for severe worsening symptoms ?

## 2021-05-18 NOTE — ED Triage Notes (Signed)
Patient via EMS due to hyperglycemia. EMS BGL >500.  ?

## 2021-05-18 NOTE — ED Provider Notes (Signed)
[facility] EMERGENCY DEPARTMENT Provider Note   CSN: [id number] Arrival date & time: 05/18/21  1303     History  No chief complaint on file.   Jordan Osborn is a 51 y.o. male.  HPI  This patient is a 51 year old male with a history of hypertension controlled with amlodipine as well as diabetes taking metformin and glipizide.  The patient is a poorly controlled diabetic and freely admits that he does not take his medication regularly in fact he has not had his medication today and cannot tell me the last time he took it.  He also endorses not following a very diabetic diet.  He lives in [place] and is followed by family doctor there.  When he called the office today complaining of his burning feet pain he was told to come to the ER.  The patient is on gabapentin for chronic neuropathy of his feet.  He denies having coughing or shortness of breath, he is not nauseated vomiting or having any diarrhea though he does keep diarrhea chronically.  No blood in the stools, no swelling of the legs, no burning with urination.  Home Medications Prior to Admission medications   Medication Sig Start Date End Date Taking? Authorizing Provider  amLODipine (NORVASC) 5 MG tablet Take 1 tablet (5 mg total) by mouth daily. 03/06/21   Catarina Hartshorn, MD  gabapentin (NEURONTIN) 300 MG capsule Take 300 mg by mouth 2 (two) times daily. 04/07/20   [provider]  glipiZIDE (GLUCOTROL XL) 5 MG 24 hr tablet Take 1 tablet (5 mg total) by mouth daily with breakfast. 03/06/21   Tat, Onalee Hua, MD  glucose monitoring kit (FREESTYLE) monitoring kit 1 each by Does not apply route as needed for other. 10/09/19   Rancour, Jeannett Senior, MD  metFORMIN (GLUCOPHAGE) 500 MG tablet Take 1 tablet (500 mg total) by mouth 2 (two) times daily with a meal. 03/06/21   Tat, Onalee Hua, MD  nitroGLYCERIN (NITROSTAT) 0.4 MG SL tablet SMARTSIG:1 Sublingual Daily 10/19/20   [provider]  pantoprazole (PROTONIX) 40 MG tablet Take 1 tablet (40 mg  total) by mouth 2 (two) times daily. 03/06/21   Catarina Hartshorn, MD      Allergies    Benadryl [diphenhydramine hcl], Morphine and related, and Omnipaque [iohexol]    Review of Systems   Review of Systems  All other systems reviewed and are negative.  Physical Exam Updated Vital Signs BP (!) 164/96   Pulse 81   Temp 98.2 F (36.8 C) (Oral)   Resp 18   Ht 1.829 m (6')   Wt 79.4 kg   SpO2 99%   BMI 23.73 kg/m  Physical Exam Vitals and nursing note reviewed.  Constitutional:      General: He is not in acute distress.    Appearance: He is well-developed.  HENT:     Head: Normocephalic and atraumatic.     Mouth/Throat:     Pharynx: No oropharyngeal exudate.  Eyes:     General: No scleral icterus.       Right eye: No discharge.        Left eye: No discharge.     Conjunctiva/sclera: Conjunctivae normal.     Pupils: Pupils are equal, round, and reactive to light.  Neck:     Thyroid: No thyromegaly.     Vascular: No JVD.  Cardiovascular:     Rate and Rhythm: Normal rate and regular rhythm.     Heart sounds: Normal heart sounds. No murmur heard.  No friction rub. No gallop.  Pulmonary:     Effort: Pulmonary effort is normal. No respiratory distress.     Breath sounds: Normal breath sounds. No wheezing or rales.  Abdominal:     General: Bowel sounds are normal. There is no distension.     Palpations: Abdomen is soft. There is no mass.     Tenderness: There is no abdominal tenderness.  Musculoskeletal:        General: Tenderness (To the bilateral feet diffusely) present. Normal range of motion.     Cervical back: Normal range of motion and neck supple.     Right lower leg: No edema.     Left lower leg: No edema.  Lymphadenopathy:     Cervical: No cervical adenopathy.  Skin:    General: Skin is warm and dry.     Findings: No erythema or rash.  Neurological:     General: No focal deficit present.     Mental Status: He is alert.     Coordination: Coordination normal.      Comments: Increased tenderness to the bilateral feet to light touch.  Normal pulses, no edema, the patient is awake alert and able to follow commands, no facial droop  Psychiatric:        Behavior: Behavior normal.    ED Results / Procedures / Treatments   Labs (all labs ordered are listed, but only abnormal results are displayed) Labs Reviewed  BASIC METABOLIC PANEL - Abnormal; Notable for the following components:      Result Value   Sodium 130 (*)    Chloride 92 (*)    Glucose, Bld 563 (*)    Creatinine, Ser 1.27 (*)    All other components within normal limits  URINALYSIS, ROUTINE W REFLEX MICROSCOPIC - Abnormal; Notable for the following components:   Color, Urine STRAW (*)    Glucose, UA >=500 (*)    Hgb urine dipstick SMALL (*)    Ketones, ur 5 (*)    Protein, ur 30 (*)    All other components within normal limits  CBG MONITORING, ED - Abnormal; Notable for the following components:   Glucose-Capillary 557 (*)    All other components within normal limits  CBG MONITORING, ED - Abnormal; Notable for the following components:   Glucose-Capillary 320 (*)    All other components within normal limits  CBC  TROPONIN I (HIGH SENSITIVITY)  TROPONIN I (HIGH SENSITIVITY)    EKG EKG Interpretation  Date/Time:  Thursday May 18 2021 13:25:16 EDT Ventricular Rate:  97 PR Interval:  154 QRS Duration: 96 QT Interval:  365 QTC Calculation: 464 R Axis:   81 Text Interpretation: Sinus rhythm Anterior infarct, acute (LAD) similar to prior EKG's - including precordial leads Confirmed by Eber Hong ([id number]) on 05/18/2021 3:31:31 PM  Radiology DG Chest Port 1 View  Result Date: 05/18/2021 CLINICAL DATA:  Hypoglycemic, chest pain EXAM: PORTABLE CHEST 1 VIEW COMPARISON:  August 2022 FINDINGS: The heart size and mediastinal contours are within normal limits. Both lungs are clear. No pleural effusion or pneumothorax. The visualized skeletal structures are unremarkable. IMPRESSION: No  acute process in the chest. Electronically Signed   By: Guadlupe Spanish M.D.   On: 05/18/2021 13:53    Procedures Procedures    Medications Ordered in ED Medications  sodium chloride 0.9 % bolus 1,000 mL (0 mLs Intravenous Stopped 05/18/21 1548)  insulin aspart (novoLOG) injection 10 Units (10 Units Intravenous Given 05/18/21 1544)  sodium chloride 0.9 %  bolus 1,000 mL (1,000 mLs Intravenous New Bag/Given 05/18/21 1541)  acetaminophen (TYLENOL) tablet 650 mg (650 mg Oral Given 05/18/21 1547)    ED Course/ Medical Decision Making/ A&P                           Medical Decision Making Amount and/or Complexity of Data Reviewed Labs: ordered. Radiology: ordered. ECG/medicine tests: ordered.  Risk OTC drugs.   This patient presents to the ED for concern of foot pain and hyperglycemia, this involves an extensive number of treatment options, and is a complaint that carries with it a high risk of complications and morbidity.  The differential diagnosis includes chronic neuropathy, less likely to be DVT or infection, there is no signs of trauma.  He has hyperglycemia is likely secondary to noncompliance as well as a diet that is not diabetic friendly.  He does not have any urinary symptoms, he is not short of breath and has no other findings on exam to suggest another cause.   Co morbidities that complicate the patient evaluation  Chronically poorly controlled diabetes as well as hypertension   Additional history obtained:  Additional history obtained from electronic medical record External records from outside source obtained and reviewed including prior admission and discharge from December 31 through January 2 where he was admitted for nausea and vomiting.  He was given IV fluids and hydrated.  He has not followed up with his family doctor since that time   Lab Tests:  I Ordered, and personally interpreted labs.  The pertinent results include: Mild hyponatremia which seems to be  pseudohyponatremia that is corrected for with glucose.  He is hyperglycemic but does not have an anion gap nor does he have a low CO2 to suggest DKA.  Recheck of the patient's blood sugar is just over 300.  Troponin negative, EKG chronic ST findings consistent with multiple old EKGs.  Doubt cardiac source   Imaging Studies ordered:  I ordered imaging studies including portable chest x-ray I independently visualized and interpreted imaging which showed no acute findings I agree with the radiologist interpretation   Cardiac Monitoring:  The patient was maintained on a cardiac monitor.  I personally viewed and interpreted the cardiac monitored which showed an underlying rhythm of: Borderline sinus tachycardia with rates between 95 and 100, this appears to be sinus rhythm   Medicines ordered and prescription drug management:  I ordered medication including IV fluids and insulin for hyperglycemia and dehydration Reevaluation of the patient after these medicines showed that the patient improved I have reviewed the patients home medicines and have made adjustments as needed   Admission considered:  However the patient is not in DKA, looks well, vital signs have normalized and has been given fluids and insulin with correction of the blood sugar to a reasonable level for discharge   Critical Interventions:  IV fluids and insulin    Problem List / ED Course:  This patient was counseled at length regarding his noncompliance with medication and this being the likely underlying cause of his metabolic dysfunction and his chronic pain and chronic neuropathy.  He was told that his chronic neuropathy would be something that he would not be able to get rid of most likely.  That he could halt the progression if you would control his diabetes.  He states he does not have a glucose monitor at home.  He will see his doctor within 48 hours for recheck  Social Determinants of Health:  Poor control  of her diabetes, he was counseled at length regarding diet and strict medication usage           Final Clinical Impression(s) / ED Diagnoses Final diagnoses:  Hyperglycemia  Poorly controlled diabetes mellitus (HCC)  Diabetic peripheral neuropathy associated with type 2 diabetes mellitus [facility])    Rx / DC Orders ED Discharge Orders     None         Eber Hong, MD 05/18/21 1707

## 2021-10-20 ENCOUNTER — Emergency Department (HOSPITAL_COMMUNITY)
Admission: EM | Admit: 2021-10-20 | Discharge: 2021-10-20 | Disposition: A | Discharge status: Home / Self Care | Payer: Medicare Other | Attending: Emergency Medicine | Admitting: Emergency Medicine

## 2021-10-20 ENCOUNTER — Emergency Department (HOSPITAL_COMMUNITY): Payer: Medicare Other

## 2021-10-20 ENCOUNTER — Encounter (HOSPITAL_COMMUNITY): Payer: Self-pay | Admitting: Emergency Medicine

## 2021-10-20 ENCOUNTER — Other Ambulatory Visit: Payer: Self-pay

## 2021-10-20 DIAGNOSIS — R519 Headache, unspecified: Secondary | ICD-10-CM | POA: Diagnosis not present

## 2021-10-20 DIAGNOSIS — I10 Essential (primary) hypertension: Secondary | ICD-10-CM | POA: Insufficient documentation

## 2021-10-20 DIAGNOSIS — E1165 Type 2 diabetes mellitus with hyperglycemia: Secondary | ICD-10-CM | POA: Insufficient documentation

## 2021-10-20 DIAGNOSIS — G8929 Other chronic pain: Secondary | ICD-10-CM | POA: Diagnosis not present

## 2021-10-20 DIAGNOSIS — R739 Hyperglycemia, unspecified: Secondary | ICD-10-CM

## 2021-10-20 DIAGNOSIS — Z794 Long term (current) use of insulin: Secondary | ICD-10-CM | POA: Insufficient documentation

## 2021-10-20 DIAGNOSIS — Z79899 Other long term (current) drug therapy: Secondary | ICD-10-CM | POA: Diagnosis not present

## 2021-10-20 DIAGNOSIS — Z7982 Long term (current) use of aspirin: Secondary | ICD-10-CM | POA: Insufficient documentation

## 2021-10-20 LAB — CBC WITH DIFFERENTIAL/PLATELET
Abs Immature Granulocytes: 0.02 10*3/uL (ref 0.00–0.07)
Basophils Absolute: 0 10*3/uL (ref 0.0–0.1)
Basophils Relative: 0 %
Eosinophils Absolute: 0.1 10*3/uL (ref 0.0–0.5)
Eosinophils Relative: 1 %
HCT: 44 % (ref 39.0–52.0)
Hemoglobin: 15.3 g/dL (ref 13.0–17.0)
Immature Granulocytes: 0 %
Lymphocytes Relative: 22 %
Lymphs Abs: 1.3 10*3/uL (ref 0.7–4.0)
MCH: 29.8 pg (ref 26.0–34.0)
MCHC: 34.8 g/dL (ref 30.0–36.0)
MCV: 85.6 fL (ref 80.0–100.0)
Monocytes Absolute: 0.7 10*3/uL (ref 0.1–1.0)
Monocytes Relative: 12 %
Neutro Abs: 3.7 10*3/uL (ref 1.7–7.7)
Neutrophils Relative %: 65 %
Platelets: 234 10*3/uL (ref 150–400)
RBC: 5.14 MIL/uL (ref 4.22–5.81)
RDW: 12.5 % (ref 11.5–15.5)
WBC: 5.8 10*3/uL (ref 4.0–10.5)
nRBC: 0 % (ref 0.0–0.2)

## 2021-10-20 LAB — BASIC METABOLIC PANEL
Anion gap: 10 (ref 5–15)
BUN: 22 mg/dL — ABNORMAL HIGH (ref 6–20)
CO2: 25 mmol/L (ref 22–32)
Calcium: 8.9 mg/dL (ref 8.9–10.3)
Chloride: 95 mmol/L — ABNORMAL LOW (ref 98–111)
Creatinine, Ser: 1.12 mg/dL (ref 0.61–1.24)
GFR, Estimated: >60 mL/min (ref >60)
Glucose, Bld: 342 mg/dL — ABNORMAL HIGH (ref 70–99)
Potassium: 4.2 mmol/L (ref 3.5–5.1)
Sodium: 130 mmol/L — ABNORMAL LOW (ref 135–145)

## 2021-10-20 LAB — CBG MONITORING, ED: Glucose-Capillary: 354 mg/dL — ABNORMAL HIGH (ref 70–99)

## 2021-10-20 MED ORDER — KETOROLAC TROMETHAMINE 15 MG/ML IJ SOLN
15.0000 mg | Freq: Once | INTRAMUSCULAR | Status: AC
  Administered 2021-10-20: 15 mg via INTRAVENOUS
  Filled 2021-10-20: qty 1

## 2021-10-20 MED ORDER — METOCLOPRAMIDE HCL 5 MG/ML IJ SOLN
10.0000 mg | Freq: Once | INTRAMUSCULAR | Status: AC
  Administered 2021-10-20: 10 mg via INTRAVENOUS
  Filled 2021-10-20: qty 2

## 2021-10-20 MED ORDER — SODIUM CHLORIDE 0.9 % IV BOLUS
500.0000 mL | Freq: Once | INTRAVENOUS | Status: AC
Start: 2021-10-20 — End: 2021-10-20
  Administered 2021-10-20: 500 mL via INTRAVENOUS

## 2021-10-20 NOTE — ED Triage Notes (Signed)
Pt BIB [facility]. Pt complaint of hypertension and hyperglycemia. Pt initial CBG by EMS 422 after 1 liter of fluid 360. Pt complains of chronic headache 10/10.

## 2021-10-20 NOTE — Discharge Instructions (Signed)
Follow up with your doctor this week.

## 2021-10-20 NOTE — ED Provider Notes (Signed)
[facility] EMERGENCY DEPARTMENT Provider Note   CSN: [id number] Arrival date & time: 10/20/21  1343     History {Add pertinent medical, surgical, social history, OB history to HPI:1} Chief Complaint  Patient presents with   Hyperglycemia    Jordan Osborn is a 51 y.o. male.  HPI     51 year old male comes in with chief complaint of elevated blood sugar.  Patient also complains of severe headache.  Patient states that he had home health nurse come to check on him.  Patient was noted to have elevated blood sugar and elevated blood pressure.  He was advised to come to the ER.  He has history of hypertension, diabetes.  He takes his medications as prescribed.  He also indicates that he has been having headaches that have been present now for 2 months.  Headaches are present daily, with no specific evoking, aggravating or relieving factors.  Specifically there is no photophobia, phonophobia.  Patient denies any associated one-sided weakness, numbness, slurred speech, balance issues.  Patient denies any photophobia.  Home Medications Prior to Admission medications   Medication Sig Start Date End Date Taking? Authorizing Provider  amLODipine (NORVASC) 5 MG tablet Take 1 tablet (5 mg total) by mouth daily. 03/06/21   Orson Eva, MD  gabapentin (NEURONTIN) 300 MG capsule Take 300 mg by mouth 2 (two) times daily. 04/07/20   [provider]  glipiZIDE (GLUCOTROL XL) 5 MG 24 hr tablet Take 1 tablet (5 mg total) by mouth daily with breakfast. 03/06/21   Tat, Shanon Brow, MD  glucose monitoring kit (FREESTYLE) monitoring kit 1 each by Does not apply route as needed for other. 10/09/19   Rancour, Annie Main, MD  metFORMIN (GLUCOPHAGE) 500 MG tablet Take 1 tablet (500 mg total) by mouth 2 (two) times daily with a meal. 03/06/21   Tat, Shanon Brow, MD  nitroGLYCERIN (NITROSTAT) 0.4 MG SL tablet SMARTSIG:1 Sublingual Daily 10/19/20   [provider]  pantoprazole (PROTONIX) 40 MG tablet Take 1 tablet (40 mg  total) by mouth 2 (two) times daily. 03/06/21   Orson Eva, MD      Allergies    Benadryl [diphenhydramine hcl], Morphine and related, and Omnipaque [iohexol]    Review of Systems   Review of Systems  All other systems reviewed and are negative.   Physical Exam Updated Vital Signs BP (!) 126/91   Pulse 88   Temp 98.3 F (36.8 C) (Oral)   Resp 16   Ht 6' (1.829 m)   Wt 79.4 kg   SpO2 97%   BMI 23.74 kg/m  Physical Exam Vitals and nursing note reviewed.  Constitutional:      Appearance: He is well-developed.  HENT:     Head: Atraumatic.  Eyes:     Extraocular Movements: Extraocular movements intact.     Pupils: Pupils are equal, round, and reactive to light.  Cardiovascular:     Rate and Rhythm: Normal rate.  Pulmonary:     Effort: Pulmonary effort is normal.  Musculoskeletal:     Cervical back: Neck supple.  Skin:    General: Skin is warm.  Neurological:     Mental Status: He is alert and oriented to person, place, and time.     Cranial Nerves: No cranial nerve deficit.     Sensory: No sensory deficit.     Motor: No weakness.     ED Results / Procedures / Treatments   Labs (all labs ordered are listed, but only abnormal results are  displayed) Labs Reviewed  BASIC METABOLIC PANEL - Abnormal; Notable for the following components:      Result Value   Sodium 130 (*)    Chloride 95 (*)    Glucose, Bld 342 (*)    BUN 22 (*)    All other components within normal limits  CBG MONITORING, ED - Abnormal; Notable for the following components:   Glucose-Capillary 354 (*)    All other components within normal limits  CBC WITH DIFFERENTIAL/PLATELET    EKG None  Radiology No results found.  Procedures Procedures  {Document cardiac monitor, telemetry assessment procedure when appropriate:1}  Medications Ordered in ED Medications  sodium chloride 0.9 % bolus 500 mL (0 mLs Intravenous Stopped 10/20/21 1523)  metoCLOPramide (REGLAN) injection 10 mg (10 mg  Intravenous Given 10/20/21 1444)    ED Course/ Medical Decision Making/ A&P                           Medical Decision Making Amount and/or Complexity of Data Reviewed Labs: ordered. Radiology: ordered.  Risk Prescription drug management.   This patient presents to the ED with chief complaint(s) of elevated blood sugar, elevated blood pressure and patient indicates having headaches for the last 2 months with pertinent past medical history of hypertension, hyperlipidemia and diabetes which further complicates the presenting complaint. The complaint involves an extensive differential diagnosis and also carries with it a high risk of complications and morbidity.    The differential diagnosis includes : Hypoglycemia, hyperglycemia without DKA, hyperglycemia with DKA, severe dehydration, renal failure, electrolyte abnormality, hypertensive urgency, tension headache, musculoskeletal headache, chronic subdural hematoma, migraines  Patient states that the headaches have been present for 2 months.  They are daily headaches with no specific triggers.  He has taken over-the-counter medicine without significant relief.  No red flag suggesting elevated intracranial pressure.  No focal neuro complaints.  Doubt intracranial hemorrhage.  The initial plan is to get basic blood work and reassess the patient. CT scan of the brain also ordered for new onset headache for someone over the age of 24.   Independent labs interpretation:  The following labs were independently interpreted: Patient's blood glucose is 342.  No anion gap.  CBC is normal.  Independent visualization of imaging: - I independently visualized the following imaging with scope of interpretation limited to determining acute life threatening conditions related to emergency care: ***, which revealed   Treatment and Reassessment: Patient's headache is improved, but not resolved.  CT scan of the brain pending.  His case will be signed out  to incoming team.  Patient will be discharged if the CT scan of the brain is negative.  Consultation: - Consulted or discussed management/test interpretation w/ external professional: ***  Consideration for admission or further workup: Social Determinants of health:  Final Clinical Impression(s) / ED Diagnoses Final diagnoses:  None    Rx / DC Orders ED Discharge Orders     None

## 2021-11-27 ENCOUNTER — Ambulatory Visit: Payer: Medicare Other | Admitting: "Endocrinology

## 2022-01-16 ENCOUNTER — Ambulatory Visit: Payer: Medicare Other | Admitting: "Endocrinology

## 2022-01-22 ENCOUNTER — Ambulatory Visit: Payer: Medicare Other | Admitting: Nutrition

## 2022-03-21 ENCOUNTER — Telehealth: Payer: Self-pay | Admitting: Nutrition

## 2022-03-21 ENCOUNTER — Encounter: Payer: Medicare Other | Attending: Emergency Medicine | Admitting: Nutrition

## 2022-03-21 DIAGNOSIS — E118 Type 2 diabetes mellitus with unspecified complications: Secondary | ICD-10-CM | POA: Insufficient documentation

## 2022-03-21 DIAGNOSIS — N182 Chronic kidney disease, stage 2 (mild): Secondary | ICD-10-CM | POA: Insufficient documentation

## 2022-03-21 NOTE — Telephone Encounter (Signed)
TC to patient to reschedule his missed appt. He notes he would like to change providers. Gave him [facility] number to call to schedule appt with new provider. Address for my office given. Advised to check BS before meals and bedtime 4 times per day and record blood sugar readings and insulin doses. He verbalized understanding.

## 2022-04-04 ENCOUNTER — Encounter: Payer: Medicare Other | Attending: Emergency Medicine | Admitting: Nutrition

## 2022-04-04 DIAGNOSIS — E118 Type 2 diabetes mellitus with unspecified complications: Secondary | ICD-10-CM | POA: Insufficient documentation

## 2022-10-25 ENCOUNTER — Other Ambulatory Visit: Payer: Self-pay

## 2022-10-25 ENCOUNTER — Emergency Department (HOSPITAL_COMMUNITY): Payer: Medicare Other

## 2022-10-25 ENCOUNTER — Emergency Department (HOSPITAL_COMMUNITY)
Admission: EM | Admit: 2022-10-25 | Discharge: 2022-10-26 | Disposition: A | Discharge status: Home / Self Care | Payer: Medicare Other | Source: Home / Self Care | Attending: Emergency Medicine | Admitting: Emergency Medicine

## 2022-10-25 ENCOUNTER — Encounter (HOSPITAL_COMMUNITY): Payer: Self-pay

## 2022-10-25 DIAGNOSIS — E1165 Type 2 diabetes mellitus with hyperglycemia: Secondary | ICD-10-CM | POA: Diagnosis not present

## 2022-10-25 DIAGNOSIS — Z79899 Other long term (current) drug therapy: Secondary | ICD-10-CM | POA: Diagnosis not present

## 2022-10-25 DIAGNOSIS — Z7984 Long term (current) use of oral hypoglycemic drugs: Secondary | ICD-10-CM | POA: Diagnosis not present

## 2022-10-25 DIAGNOSIS — Z794 Long term (current) use of insulin: Secondary | ICD-10-CM | POA: Insufficient documentation

## 2022-10-25 DIAGNOSIS — I10 Essential (primary) hypertension: Secondary | ICD-10-CM | POA: Diagnosis not present

## 2022-10-25 DIAGNOSIS — Z7982 Long term (current) use of aspirin: Secondary | ICD-10-CM | POA: Insufficient documentation

## 2022-10-25 DIAGNOSIS — R1084 Generalized abdominal pain: Secondary | ICD-10-CM

## 2022-10-25 DIAGNOSIS — R739 Hyperglycemia, unspecified: Secondary | ICD-10-CM

## 2022-10-25 DIAGNOSIS — R1010 Upper abdominal pain, unspecified: Secondary | ICD-10-CM | POA: Diagnosis present

## 2022-10-25 LAB — COMPREHENSIVE METABOLIC PANEL
ALT: 15 U/L (ref 0–44)
AST: 16 U/L (ref 15–41)
Albumin: 3.4 g/dL — ABNORMAL LOW (ref 3.5–5.0)
Alkaline Phosphatase: 128 U/L — ABNORMAL HIGH (ref 38–126)
Anion gap: 8 (ref 5–15)
BUN: 15 mg/dL (ref 6–20)
CO2: 27 mmol/L (ref 22–32)
Calcium: 9 mg/dL (ref 8.9–10.3)
Chloride: 98 mmol/L (ref 98–111)
Creatinine, Ser: 0.92 mg/dL (ref 0.61–1.24)
GFR, Estimated: >60 mL/min (ref >60)
Glucose, Bld: 427 mg/dL — ABNORMAL HIGH (ref 70–99)
Potassium: 3.5 mmol/L (ref 3.5–5.1)
Sodium: 133 mmol/L — ABNORMAL LOW (ref 135–145)
Total Bilirubin: 0.7 mg/dL (ref 0.3–1.2)
Total Protein: 6.5 g/dL (ref 6.5–8.1)

## 2022-10-25 LAB — URINALYSIS, ROUTINE W REFLEX MICROSCOPIC
Bacteria, UA: NONE SEEN
Bilirubin Urine: NEGATIVE
Glucose, UA: >=500 mg/dL — AB
Hgb urine dipstick: NEGATIVE
Ketones, ur: NEGATIVE mg/dL
Leukocytes,Ua: NEGATIVE
Nitrite: NEGATIVE
Protein, ur: NEGATIVE mg/dL
Specific Gravity, Urine: 1.029 (ref 1.005–1.030)
pH: 6 (ref 5.0–8.0)

## 2022-10-25 LAB — CBC
HCT: 40.5 % (ref 39.0–52.0)
Hemoglobin: 14 g/dL (ref 13.0–17.0)
MCH: 30.2 pg (ref 26.0–34.0)
MCHC: 34.6 g/dL (ref 30.0–36.0)
MCV: 87.3 fL (ref 80.0–100.0)
Platelets: 209 10*3/uL (ref 150–400)
RBC: 4.64 MIL/uL (ref 4.22–5.81)
RDW: 13.6 % (ref 11.5–15.5)
WBC: 5.1 10*3/uL (ref 4.0–10.5)
nRBC: 0 % (ref 0.0–0.2)

## 2022-10-25 LAB — LIPASE, BLOOD: Lipase: 27 U/L (ref 11–51)

## 2022-10-25 MED ORDER — SODIUM CHLORIDE 0.9 % IV SOLN: Freq: Once | INTRAVENOUS | Status: AC

## 2022-10-25 NOTE — ED Triage Notes (Signed)
Pt reports global abd pain x 4 days, denies any N/V/diarrhea or constipation.  Pt also reports right leg pain from the knee to the foot and right arm pain.

## 2022-10-26 DIAGNOSIS — E1165 Type 2 diabetes mellitus with hyperglycemia: Secondary | ICD-10-CM | POA: Diagnosis not present

## 2022-10-26 LAB — CBG MONITORING, ED: Glucose-Capillary: 245 mg/dL — ABNORMAL HIGH (ref 70–99)

## 2022-10-26 MED ORDER — KETOROLAC TROMETHAMINE 15 MG/ML IJ SOLN
15.0000 mg | Freq: Once | INTRAMUSCULAR | Status: AC
  Administered 2022-10-26: 15 mg via INTRAVENOUS
  Filled 2022-10-26: qty 1

## 2022-10-26 NOTE — ED Provider Notes (Signed)
[place] EMERGENCY DEPARTMENT AT [facility] Provider Note   CSN: [id number] Arrival date & time: 10/25/22  1937     History  Chief Complaint  Patient presents with   Abdominal Pain    Jordan Osborn is a 52 y.o. male.   Abdominal Pain Patient with history of diabetes.  Upper abdominal pain.  Diffuse.  Denies nausea or vomiting.  States he does have a headache.  Does have apparent history of somewhat chronic abdominal pain by notes.  States he is on antibiotics to help with tooth infection.    Past Medical History:  Diagnosis Date   Diabetes mellitus without complication (HCC)    Hypertension     Home Medications Prior to Admission medications   Medication Sig Start Date End Date Taking? Authorizing Provider  amLODipine (NORVASC) 5 MG tablet Take 1 tablet (5 mg total) by mouth daily. Patient not taking: Reported on 10/20/2021 03/06/21   Catarina Hartshorn, MD  aspirin 81 MG chewable tablet Chew 162 mg by mouth daily.    [provider]  atorvastatin (LIPITOR) 40 MG tablet Take 40 mg by mouth daily. 09/29/21   [provider]  azelastine (ASTELIN) 0.1 % nasal spray Place into both nostrils. Patient not taking: Reported on 10/20/2021 09/18/21   [provider]  carvedilol (COREG) 3.125 MG tablet Take 3.125 mg by mouth 2 (two) times daily. 09/29/21   [provider]  gabapentin (NEURONTIN) 300 MG capsule Take 300 mg by mouth 2 (two) times daily. Patient not taking: Reported on 10/20/2021 04/07/20   [provider]  glucose monitoring kit (FREESTYLE) monitoring kit 1 each by Does not apply route as needed for other. 10/09/19   Rancour, Jeannett Senior, MD  labetalol (NORMODYNE) 100 MG tablet Take by mouth daily. Patient not taking: Reported on 10/20/2021 08/23/21   [provider]  LEVEMIR FLEXPEN 100 UNIT/ML FlexPen Inject 8 Units into the skin daily. 09/18/21   [provider]  lisinopril (ZESTRIL) 5 MG tablet Take 5 mg by mouth daily.  09/29/21   [provider]  losartan (COZAAR) 50 MG tablet Take 50 mg by mouth daily. Patient not taking: Reported on 10/20/2021 08/23/21   [provider]  metFORMIN (GLUCOPHAGE) 500 MG tablet Take 1 tablet (500 mg total) by mouth 2 (two) times daily with a meal. Patient not taking: Reported on 10/20/2021 03/06/21   Catarina Hartshorn, MD  nitroGLYCERIN (NITROSTAT) 0.4 MG SL tablet Place 0.4 mg under the tongue every 5 (five) minutes as needed for chest pain. 10/19/20   [provider]  NOVOLOG FLEXPEN 100 UNIT/ML FlexPen Inject 6 Units into the skin 3 (three) times daily with meals. 09/18/21   [provider]  pantoprazole (PROTONIX) 40 MG tablet Take 1 tablet (40 mg total) by mouth 2 (two) times daily. Patient not taking: Reported on 10/20/2021 03/06/21   Catarina Hartshorn, MD  pioglitazone (ACTOS) 15 MG tablet Take 15 mg by mouth daily. Patient not taking: Reported on 10/20/2021 09/29/21   [provider]  pregabalin (LYRICA) 100 MG capsule Take 100 mg by mouth 2 (two) times daily. 09/29/21   [provider]  traMADol (ULTRAM) 50 MG tablet Take 50 mg by mouth every 4 (four) hours as needed. 09/14/21   [provider]      Allergies    Benadryl [diphenhydramine hcl], Morphine and codeine, and Omnipaque [iohexol]    Review of Systems   Review of Systems  Gastrointestinal:  Positive for abdominal pain.  Physical Exam Updated Vital Signs BP (!) 147/110   Pulse 83   Temp 98.4 F (36.9 C) (Oral)   Resp 16   Ht 6' (1.829 m)   Wt 92.7 kg   SpO2 92%   BMI 27.72 kg/m  Physical Exam Vitals and nursing note reviewed.  Cardiovascular:     Rate and Rhythm: Normal rate and regular rhythm.  Abdominal:     Tenderness: There is abdominal tenderness.     Comments: Upper abdominal tenderness rebound or guarding.  No hernia palpated.  Skin:    General: Skin is warm.  Neurological:     Mental Status: He is alert.     ED Results / Procedures /  Treatments   Labs (all labs ordered are listed, but only abnormal results are displayed) Labs Reviewed  COMPREHENSIVE METABOLIC PANEL - Abnormal; Notable for the following components:      Result Value   Sodium 133 (*)    Glucose, Bld 427 (*)    Albumin 3.4 (*)    Alkaline Phosphatase 128 (*)    All other components within normal limits  URINALYSIS, ROUTINE W REFLEX MICROSCOPIC - Abnormal; Notable for the following components:   Glucose, UA >=500 (*)    All other components within normal limits  LIPASE, BLOOD  CBC  CBG MONITORING, ED    EKG None  Radiology CT ABDOMEN PELVIS WO CONTRAST  Result Date: 10/25/2022 CLINICAL DATA:  Global abdominal pain.  Epigastric pain. EXAM: CT ABDOMEN AND PELVIS WITHOUT CONTRAST TECHNIQUE: Multidetector CT imaging of the abdomen and pelvis was performed following the standard protocol without IV contrast. RADIATION DOSE REDUCTION: This exam was performed according to the departmental dose-optimization program which includes automated exposure control, adjustment of the mA and/or kV according to patient size and/or use of iterative reconstruction technique. COMPARISON:  CT abdomen and pelvis 03/04/2021 FINDINGS: Lower chest: No acute abnormality. Hepatobiliary: Unremarkable liver.  No biliary dilation. Pancreas: Unremarkable. Spleen: Unremarkable. Adrenals/Urinary Tract: Normal adrenal glands. No urinary calculi or hydronephrosis. Bladder is unremarkable. Stomach/Bowel: Normal caliber large and small bowel. No bowel wall thickening. The appendix is not visualized.Stomach is within normal limits. Vascular/Lymphatic: Aortic atherosclerosis. No enlarged abdominal or pelvic lymph nodes. Reproductive: Unremarkable. Other: No free intraperitoneal fluid or air. Musculoskeletal: No acute fracture. IMPRESSION: 1. No acute abnormality in the abdomen or pelvis. Aortic Atherosclerosis (ICD10-I70.0). Electronically Signed   By: Minerva Fester M.D.   On: 10/25/2022 23:52     Procedures Procedures    Medications Ordered in ED Medications  ketorolac (TORADOL) 15 MG/ML injection 15 mg (has no administration in time range)  0.9 %  sodium chloride infusion (0 mLs Intravenous Stopped 10/25/22 2320)    ED Course/ Medical Decision Making/ A&P                                 Medical Decision Making Amount and/or Complexity of Data Reviewed Labs: ordered. Radiology: ordered.   Patient hyperglycemia.  Without vomiting.  Does have a history of somewhat chronic abdominal pain.  However sugar is close to 400.  Does not appear to DKA.  Given fluid bolus.  Will recheck.  May require more medicine to get down.  CT scan done due to the pain and previous obstruction is reassuring.  Somewhat vague complaints overall with headache and head also.  Will give some Toradol.  Care turned over to Dr. Oletta Cohn  Final Clinical Impression(s) / ED Diagnoses Final diagnoses:  Generalized abdominal pain  Hyperglycemia    Rx / DC Orders ED Discharge Orders     None         Benjiman Core, MD 10/26/22 [phone]

## 2022-10-26 NOTE — ED Notes (Signed)
Pt called several family members for a ride, and they all stated that they could not come and get him due to having to work in the morning. Ride pending.

## 2022-10-26 NOTE — ED Notes (Signed)
Pt attempting to call family again for ride.

## 2023-03-02 ENCOUNTER — Emergency Department (HOSPITAL_COMMUNITY)
Admission: EM | Admit: 2023-03-02 | Discharge: 2023-03-02 | Disposition: A | Discharge status: Home / Self Care | Payer: Medicare Other | Attending: Emergency Medicine | Admitting: Emergency Medicine

## 2023-03-02 ENCOUNTER — Other Ambulatory Visit: Payer: Self-pay

## 2023-03-02 ENCOUNTER — Emergency Department (HOSPITAL_COMMUNITY): Payer: Medicare Other

## 2023-03-02 ENCOUNTER — Encounter (HOSPITAL_COMMUNITY): Payer: Self-pay

## 2023-03-02 DIAGNOSIS — E1169 Type 2 diabetes mellitus with other specified complication: Secondary | ICD-10-CM | POA: Diagnosis not present

## 2023-03-02 DIAGNOSIS — Z7984 Long term (current) use of oral hypoglycemic drugs: Secondary | ICD-10-CM | POA: Insufficient documentation

## 2023-03-02 DIAGNOSIS — Z7982 Long term (current) use of aspirin: Secondary | ICD-10-CM | POA: Diagnosis not present

## 2023-03-02 DIAGNOSIS — Z20822 Contact with and (suspected) exposure to covid-19: Secondary | ICD-10-CM | POA: Insufficient documentation

## 2023-03-02 DIAGNOSIS — Z794 Long term (current) use of insulin: Secondary | ICD-10-CM | POA: Insufficient documentation

## 2023-03-02 DIAGNOSIS — Z79899 Other long term (current) drug therapy: Secondary | ICD-10-CM | POA: Diagnosis not present

## 2023-03-02 DIAGNOSIS — R053 Chronic cough: Secondary | ICD-10-CM | POA: Diagnosis not present

## 2023-03-02 DIAGNOSIS — R059 Cough, unspecified: Secondary | ICD-10-CM | POA: Diagnosis present

## 2023-03-02 DIAGNOSIS — E119 Type 2 diabetes mellitus without complications: Secondary | ICD-10-CM | POA: Diagnosis not present

## 2023-03-02 DIAGNOSIS — I10 Essential (primary) hypertension: Secondary | ICD-10-CM | POA: Diagnosis not present

## 2023-03-02 LAB — CBC WITH DIFFERENTIAL/PLATELET
Abs Immature Granulocytes: 0.05 10*3/uL (ref 0.00–0.07)
Basophils Absolute: 0 10*3/uL (ref 0.0–0.1)
Basophils Relative: 0 %
Eosinophils Absolute: 0.1 10*3/uL (ref 0.0–0.5)
Eosinophils Relative: 1 %
HCT: 46.8 % (ref 39.0–52.0)
Hemoglobin: 15.6 g/dL (ref 13.0–17.0)
Immature Granulocytes: 1 %
Lymphocytes Relative: 25 %
Lymphs Abs: 1.6 10*3/uL (ref 0.7–4.0)
MCH: 29.1 pg (ref 26.0–34.0)
MCHC: 33.3 g/dL (ref 30.0–36.0)
MCV: 87.2 fL (ref 80.0–100.0)
Monocytes Absolute: 0.7 10*3/uL (ref 0.1–1.0)
Monocytes Relative: 11 %
Neutro Abs: 4 10*3/uL (ref 1.7–7.7)
Neutrophils Relative %: 62 %
Platelets: 243 10*3/uL (ref 150–400)
RBC: 5.37 MIL/uL (ref 4.22–5.81)
RDW: 12.9 % (ref 11.5–15.5)
WBC: 6.5 10*3/uL (ref 4.0–10.5)
nRBC: 0 % (ref 0.0–0.2)

## 2023-03-02 LAB — BASIC METABOLIC PANEL
Anion gap: 9 (ref 5–15)
BUN: 18 mg/dL (ref 6–20)
CO2: 27 mmol/L (ref 22–32)
Calcium: 9.4 mg/dL (ref 8.9–10.3)
Chloride: 95 mmol/L — ABNORMAL LOW (ref 98–111)
Creatinine, Ser: 1.09 mg/dL (ref 0.61–1.24)
GFR, Estimated: >60 mL/min (ref >60)
Glucose, Bld: 376 mg/dL — ABNORMAL HIGH (ref 70–99)
Potassium: 4 mmol/L (ref 3.5–5.1)
Sodium: 131 mmol/L — ABNORMAL LOW (ref 135–145)

## 2023-03-02 LAB — BLOOD GAS, VENOUS
Acid-Base Excess: 5 mmol/L — ABNORMAL HIGH (ref 0.0–2.0)
Bicarbonate: 29.1 mmol/L — ABNORMAL HIGH (ref 20.0–28.0)
Drawn by: 1517
O2 Saturation: 89.2 %
Patient temperature: 36.9
pCO2, Ven: 40 mm[Hg] — ABNORMAL LOW (ref 44–60)
pH, Ven: 7.47 — ABNORMAL HIGH (ref 7.25–7.43)
pO2, Ven: 53 mm[Hg] — ABNORMAL HIGH (ref 32–45)

## 2023-03-02 LAB — URINALYSIS, ROUTINE W REFLEX MICROSCOPIC
Bacteria, UA: NONE SEEN
Bilirubin Urine: NEGATIVE
Glucose, UA: >=500 mg/dL — AB
Ketones, ur: NEGATIVE mg/dL
Leukocytes,Ua: NEGATIVE
Nitrite: NEGATIVE
Protein, ur: 30 mg/dL — AB
Specific Gravity, Urine: 1.026 (ref 1.005–1.030)
pH: 5 (ref 5.0–8.0)

## 2023-03-02 LAB — RESP PANEL BY RT-PCR (RSV, FLU A&B, COVID)  RVPGX2
Influenza A by PCR: NEGATIVE
Influenza B by PCR: NEGATIVE
Resp Syncytial Virus by PCR: NEGATIVE
SARS Coronavirus 2 by RT PCR: NEGATIVE

## 2023-03-02 LAB — CBG MONITORING, ED: Glucose-Capillary: 374 mg/dL — ABNORMAL HIGH (ref 70–99)

## 2023-03-02 MED ORDER — LOSARTAN POTASSIUM 50 MG PO TABS: 50.0000 mg | ORAL_TABLET | Freq: Every day | ORAL | 2 refills | Status: DC

## 2023-03-02 MED ORDER — BENZONATATE 100 MG PO CAPS: 100.0000 mg | ORAL_CAPSULE | Freq: Three times a day (TID) | ORAL | 0 refills | Status: DC

## 2023-03-02 MED ORDER — METFORMIN HCL 500 MG PO TABS: 500.0000 mg | ORAL_TABLET | Freq: Two times a day (BID) | ORAL | 2 refills | Status: DC

## 2023-03-02 NOTE — ED Triage Notes (Addendum)
Pt reports he has had a cough x 1 month and was seen at a hospital in [place] and they told him he was keeping his house too hot and did not give him anything.  Pt said his cough is not productive but he has coughing spells where he can not quit coughing.

## 2023-03-02 NOTE — ED Provider Notes (Signed)
EMERGENCY DEPARTMENT AT [facility] Provider Note   CSN: [id number] Arrival date & time: 03/02/23  1830     History {Add pertinent medical, surgical, social history, OB history to HPI:1} Chief Complaint  Patient presents with   Cough    Jordan Osborn is a 52 y.o. male.  52 year old male with a history of diabetes and hypertension who presents emergency department with 4 weeks of cough.  Patient reports that for the past month he has had a nonproductive cough.  Says that he has had some sweating but no fevers or chills otherwise.  No tobacco use.  No history of DVT or PE.  Not on blood thinners.  Says his blood sugar was high recently as well I would like for her to be checked.  No runny nose or sore throat.  Says that he has had reflux sensation occasionally.       Home Medications Prior to Admission medications   Medication Sig Start Date End Date Taking? Authorizing Provider  amLODipine (NORVASC) 5 MG tablet Take 1 tablet (5 mg total) by mouth daily. Patient not taking: Reported on 10/20/2021 03/06/21   Catarina Hartshorn, MD  aspirin 81 MG chewable tablet Chew 162 mg by mouth daily.    [provider]  atorvastatin (LIPITOR) 40 MG tablet Take 40 mg by mouth daily. 09/29/21   [provider]  azelastine (ASTELIN) 0.1 % nasal spray Place into both nostrils. Patient not taking: Reported on 10/20/2021 09/18/21   [provider]  carvedilol (COREG) 3.125 MG tablet Take 3.125 mg by mouth 2 (two) times daily. 09/29/21   [provider]  gabapentin (NEURONTIN) 300 MG capsule Take 300 mg by mouth 2 (two) times daily. Patient not taking: Reported on 10/20/2021 04/07/20   [provider]  glucose monitoring kit (FREESTYLE) monitoring kit 1 each by Does not apply route as needed for other. 10/09/19   Rancour, Jeannett Senior, MD  labetalol (NORMODYNE) 100 MG tablet Take by mouth daily. Patient not taking: Reported on 10/20/2021 08/23/21   [provider]  LEVEMIR FLEXPEN 100 UNIT/ML FlexPen Inject 8 Units into the skin daily. 09/18/21   [provider]  lisinopril (ZESTRIL) 5 MG tablet Take 5 mg by mouth daily. 09/29/21   [provider]  losartan (COZAAR) 50 MG tablet Take 50 mg by mouth daily. Patient not taking: Reported on 10/20/2021 08/23/21   [provider]  metFORMIN (GLUCOPHAGE) 500 MG tablet Take 1 tablet (500 mg total) by mouth 2 (two) times daily with a meal. Patient not taking: Reported on 10/20/2021 03/06/21   Catarina Hartshorn, MD  nitroGLYCERIN (NITROSTAT) 0.4 MG SL tablet Place 0.4 mg under the tongue every 5 (five) minutes as needed for chest pain. 10/19/20   [provider]  NOVOLOG FLEXPEN 100 UNIT/ML FlexPen Inject 6 Units into the skin 3 (three) times daily with meals. 09/18/21   [provider]  pantoprazole (PROTONIX) 40 MG tablet Take 1 tablet (40 mg total) by mouth 2 (two) times daily. Patient not taking: Reported on 10/20/2021 03/06/21   Catarina Hartshorn, MD  pioglitazone (ACTOS) 15 MG tablet Take 15 mg by mouth daily. Patient not taking: Reported on 10/20/2021 09/29/21   [provider]  pregabalin (LYRICA) 100 MG capsule Take 100 mg by mouth 2 (two) times daily. 09/29/21   [provider]  traMADol (ULTRAM) 50 MG tablet Take 50 mg by mouth every 4 (four) hours as needed. 09/14/21   [provider]      Allergies    Benadryl [diphenhydramine hcl], Morphine and codeine, and Omnipaque [iohexol]    Review of Systems   Review of Systems  Physical Exam Updated Vital Signs BP (!) 154/95 (BP Location: Right Arm)   Pulse 90   Temp 98.5 F (36.9 C) (Oral)   Resp 18   Ht 6' (1.829 m)   Wt 90.7 kg   SpO2 98%   BMI 27.12 kg/m  Physical Exam Vitals and nursing note reviewed.  Constitutional:      General: He is not in acute distress.    Appearance: He is well-developed.  HENT:     Head: Normocephalic and atraumatic.     Right Ear: External ear normal.      Left Ear: External ear normal.     Nose: Nose normal.  Eyes:     Extraocular Movements: Extraocular movements intact.     Conjunctiva/sclera: Conjunctivae normal.     Pupils: Pupils are equal, round, and reactive to light.  Cardiovascular:     Rate and Rhythm: Normal rate and regular rhythm.     Heart sounds: Normal heart sounds.  Pulmonary:     Effort: Pulmonary effort is normal. No respiratory distress.     Breath sounds: Normal breath sounds.  Musculoskeletal:     Cervical back: Normal range of motion and neck supple.     Right lower leg: No edema.     Left lower leg: No edema.  Skin:    General: Skin is warm and dry.  Neurological:     Mental Status: He is alert. Mental status is at baseline.  Psychiatric:        Mood and Affect: Mood normal.        Behavior: Behavior normal.     ED Results / Procedures / Treatments   Labs (all labs ordered are listed, but only abnormal results are displayed) Labs Reviewed  RESP PANEL BY RT-PCR (RSV, FLU A&B, COVID)  [id number]    EKG None  Radiology No results found.  Procedures Procedures  {Document cardiac monitor, telemetry assessment procedure when appropriate:1}  Medications Ordered in ED Medications - No data to display  ED Course/ Medical Decision Making/ A&P   {   Click here for ABCD2, HEART and other calculatorsREFRESH Note before signing :1}                              Medical Decision Making Amount and/or Complexity of Data Reviewed Labs: ordered. Radiology: ordered.   ***  {Document critical care time when appropriate:1} {Document review of labs and clinical decision tools ie heart score, Chads2Vasc2 etc:1}  {Document your independent review of radiology images, and any outside records:1} {Document your discussion with family members, caretakers, and with consultants:1} {Document social determinants of health affecting pt's care:1} {Document your decision making why or why not admission, treatments  were needed:1} Final Clinical Impression(s) / ED Diagnoses Final diagnoses:  None    Rx / DC Orders ED Discharge Orders     None

## 2023-03-02 NOTE — Discharge Instructions (Addendum)
You were seen for your cough in the emergency department.   At home, please STOP taking the lisinopril. START taking the losartan and metformin.    Check your MyChart online for the results of any tests that had not resulted by the time you left the emergency department.   Follow-up with your primary doctor in 2-3 days regarding your visit.    Return immediately to the emergency department if you experience any of the following: difficulty breathing, or any other concerning symptoms.    Thank you for visiting our Emergency Department. It was a pleasure taking care of you today.

## 2023-03-07 LAB — BASIC METABOLIC PANEL WITH GFR
BUN: 21 (ref 4–21)
Creatinine: 1.3 (ref 0.6–1.3)
Glucose: 468

## 2023-03-07 LAB — MICROALBUMIN / CREATININE URINE RATIO: Microalb Creat Ratio: 452

## 2023-03-07 LAB — LIPID PANEL
LDL Cholesterol: 100
Triglycerides: 145 (ref 40–160)

## 2023-03-07 LAB — VITAMIN D 25 HYDROXY (VIT D DEFICIENCY, FRACTURES): Vit D, 25-Hydroxy: 31

## 2023-03-07 LAB — TSH: TSH: 3.8 (ref 0.41–5.90)

## 2023-03-07 LAB — HEPATIC FUNCTION PANEL: Alkaline Phosphatase: 198 — AB (ref 25–125)

## 2023-03-07 LAB — HEMOGLOBIN A1C: Hemoglobin A1C: >14

## 2023-03-07 LAB — COMPREHENSIVE METABOLIC PANEL WITH GFR: eGFR: 68

## 2023-04-16 ENCOUNTER — Ambulatory Visit: Payer: Medicare Other | Admitting: Podiatry

## 2023-09-09 NOTE — Patient Instructions (Signed)

## 2023-09-12 ENCOUNTER — Ambulatory Visit (INDEPENDENT_AMBULATORY_CARE_PROVIDER_SITE_OTHER): Admitting: Nurse Practitioner

## 2023-09-12 ENCOUNTER — Telehealth: Payer: Self-pay | Admitting: Nurse Practitioner

## 2023-09-12 ENCOUNTER — Encounter: Payer: Self-pay | Admitting: Nurse Practitioner

## 2023-09-12 VITALS — BP 132/82 | HR 127 | Ht 72.0 in | Wt 209.4 lb

## 2023-09-12 DIAGNOSIS — Z7984 Long term (current) use of oral hypoglycemic drugs: Secondary | ICD-10-CM | POA: Diagnosis not present

## 2023-09-12 DIAGNOSIS — E1165 Type 2 diabetes mellitus with hyperglycemia: Secondary | ICD-10-CM

## 2023-09-12 DIAGNOSIS — E782 Mixed hyperlipidemia: Secondary | ICD-10-CM

## 2023-09-12 DIAGNOSIS — E559 Vitamin D deficiency, unspecified: Secondary | ICD-10-CM

## 2023-09-12 DIAGNOSIS — Z794 Long term (current) use of insulin: Secondary | ICD-10-CM

## 2023-09-12 DIAGNOSIS — I10 Essential (primary) hypertension: Secondary | ICD-10-CM

## 2023-09-12 LAB — POCT GLYCOSYLATED HEMOGLOBIN (HGB A1C): Hemoglobin A1C: 11.7 % — AB (ref 4.0–5.6)

## 2023-09-12 MED ORDER — INSULIN ASPART FLEXPEN 100 UNIT/ML ~~LOC~~ SOPN: 8.0000 [IU] | PEN_INJECTOR | Freq: Three times a day (TID) | SUBCUTANEOUS | 3 refills | Status: DC

## 2023-09-12 MED ORDER — TRESIBA FLEXTOUCH 100 UNIT/ML ~~LOC~~ SOPN: 30.0000 [IU] | PEN_INJECTOR | Freq: Every day | SUBCUTANEOUS | 1 refills | Status: DC

## 2023-09-12 NOTE — Progress Notes (Addendum)
 Endocrinology Consult Note       09/12/2023, 11:59 AM   Subjective:    Patient ID: Jordan Osborn, male    DOB: 1970-08-09.  Jordan Osborn is being seen in consultation for management of currently uncontrolled symptomatic diabetes requested by  Doroteo Deward BIRCH, FNP.   Past Medical History:  Diagnosis Date   Diabetes mellitus without complication (HCC)    Hypertension     Past Surgical History:  Procedure Laterality Date   CHOLECYSTECTOMY     HERNIA REPAIR      Social History   Socioeconomic History   Marital status: Single    Spouse name: Not on file   Number of children: Not on file   Years of education: Not on file   Highest education level: Not on file  Occupational History   Not on file  Tobacco Use   Smoking status: Never   Smokeless tobacco: Never  Vaping Use   Vaping status: Never Used  Substance and Sexual Activity   Alcohol use: Not Currently    Comment: occasionally   Drug use: No   Sexual activity: Not on file  Other Topics Concern   Not on file  Social History Narrative   Not on file   Social Drivers of Health   Financial Resource Strain: Low Risk  (07/09/2023)   Received from [facility]   Overall Financial Resource Strain (CARDIA)    Difficulty of Paying Living Expenses: Not hard at all  Food Insecurity: No Food Insecurity (07/09/2023)   Received from [facility]   Hunger Vital Sign    Within the past 12 months, you worried that your food would run out before you got the money to buy more.: Never true    Within the past 12 months, the food you bought just didn't last and you didn't have money to get more.: Never true  Transportation Needs: No Transportation Needs (07/09/2023)   Received from [facility] - Transportation    In the past 12 months, has lack of transportation kept you from medical appointments or from getting  medications?: No    Lack of Transportation (Non-Medical): No  Physical Activity: Not on file  Stress: Not on file  Social Connections: Not on file    History reviewed. No pertinent family history.  Outpatient Encounter Medications as of 09/12/2023  Medication Sig   insulin  degludec (TRESIBA  FLEXTOUCH) 100 UNIT/ML FlexTouch Pen Inject 30 Units into the skin at bedtime.   [DISCONTINUED] LEVEMIR FLEXPEN 100 UNIT/ML FlexPen Inject 8 Units into the skin daily.   amLODipine  (NORVASC ) 5 MG tablet Take 1 tablet (5 mg total) by mouth daily. (Patient not taking: Reported on 09/12/2023)   aspirin 81 MG chewable tablet Chew 162 mg by mouth daily. (Patient not taking: Reported on 09/12/2023)   atorvastatin (LIPITOR) 40 MG tablet Take 40 mg by mouth daily. (Patient not taking: Reported on 09/12/2023)   azelastine (ASTELIN) 0.1 % nasal spray Place into both nostrils. (Patient not taking: Reported on 10/20/2021)   benzonatate  (TESSALON ) 100 MG capsule Take 1 capsule (100 mg total) by mouth every 8 (eight) hours. (Patient not  taking: Reported on 09/12/2023)   carvedilol (COREG) 3.125 MG tablet Take 3.125 mg by mouth 2 (two) times daily. (Patient not taking: Reported on 09/12/2023)   gabapentin (NEURONTIN) 300 MG capsule Take 300 mg by mouth 2 (two) times daily. (Patient not taking: Reported on 10/20/2021)   glucose monitoring kit (FREESTYLE) monitoring kit 1 each by Does not apply route as needed for other. (Patient not taking: Reported on 09/12/2023)   Insulin  Aspart FlexPen (NOVOLOG ) 100 UNIT/ML Inject 8 Units into the skin 3 (three) times daily with meals.   labetalol (NORMODYNE) 100 MG tablet Take by mouth daily. (Patient not taking: Reported on 10/20/2021)   losartan  (COZAAR ) 50 MG tablet Take 1 tablet (50 mg total) by mouth daily. (Patient not taking: Reported on 09/12/2023)   nitroGLYCERIN (NITROSTAT) 0.4 MG SL tablet Place 0.4 mg under the tongue every 5 (five) minutes as needed for chest pain. (Patient not  taking: Reported on 09/12/2023)   pantoprazole  (PROTONIX ) 40 MG tablet Take 1 tablet (40 mg total) by mouth 2 (two) times daily. (Patient not taking: Reported on 09/12/2023)   pregabalin (LYRICA) 100 MG capsule Take 100 mg by mouth 2 (two) times daily. (Patient not taking: Reported on 09/12/2023)   traMADol (ULTRAM) 50 MG tablet Take 50 mg by mouth every 4 (four) hours as needed. (Patient not taking: Reported on 09/12/2023)   [DISCONTINUED] metFORMIN  (GLUCOPHAGE ) 500 MG tablet Take 1 tablet (500 mg total) by mouth 2 (two) times daily with a meal. (Patient not taking: Reported on 09/12/2023)   [DISCONTINUED] NOVOLOG  FLEXPEN 100 UNIT/ML FlexPen Inject 6 Units into the skin 3 (three) times daily with meals.   [DISCONTINUED] pioglitazone (ACTOS) 15 MG tablet Take 15 mg by mouth daily. (Patient not taking: Reported on 09/12/2023)   No facility-administered encounter medications on file as of 09/12/2023.    ALLERGIES: Allergies  Allergen Reactions   Benadryl [Diphenhydramine Hcl]    Morphine  And Codeine    Omnipaque  [Iohexol ] Hives and Itching    Patient experienced hive and severe itching whole body after Omnipaque  injection.  Solumedrol and pepsed administered in ED after exam    VACCINATION STATUS: Immunization History  Administered Date(s) Administered   Influenza Split 12/11/2011   Influenza,inj,Quad PF,6+ Mos 03/05/2021   Moderna Covid-19 Vaccine Bivalent Booster 5yrs & up 03/06/2021    Diabetes He presents for his initial diabetic visit. He has type 2 diabetes mellitus. Onset time: diagnosed at approx age of 48. His disease course has been fluctuating. There are no hypoglycemic associated symptoms. Associated symptoms include blurred vision, chest pain, fatigue, foot paresthesias, polydipsia, polyuria and weight loss. There are no hypoglycemic complications. Symptoms are worsening. Diabetic complications include nephropathy and peripheral neuropathy. (Multiple instances of DKA) Risk factors  for coronary artery disease include diabetes mellitus, dyslipidemia, family history, male sex and hypertension. Current diabetic treatment includes intensive insulin  program. He is compliant with treatment some of the time. His weight is fluctuating minimally. He is following a generally unhealthy diet. When asked about meal planning, he reported none. He has not had a previous visit with a dietitian. He participates in exercise intermittently. (He presents today for his consultation, accompanied by his community paramedic, with no meter or logs to review.  His POCT A1c today is 11.7%, improving from last A1c of >14%.  He is not consistent with taking his medications.  He notes he took his Bahena acting insulin  this morning but has not taken the Novolog  in over 3 weeks.  He has significant  neuropathy and proteinuria, but no other complications from diabetes.  He drinks root beer, water, and only eats when he is hungry.  He stays active with cutting grass.  His is UTD on eye exam, sees podiatry as well.) An ACE inhibitor/angiotensin II receptor blocker is being taken. He sees a podiatrist.Eye exam is current.     Review of systems  Constitutional: + increasing body weight, current Body mass index is 28.4 kg/m., no fatigue, no subjective hyperthermia, no subjective hypothermia Eyes: no blurry vision, no xerophthalmia ENT: no sore throat, no nodules palpated in throat, no dysphagia/odynophagia, no hoarseness Cardiovascular: + chest pain-caregiver notes he complains of this often, no shortness of breath, no palpitations, no leg swelling Respiratory: no cough, no shortness of breath Gastrointestinal: no nausea/vomiting/diarrhea Musculoskeletal: no muscle/joint aches Skin: no rashes, no hyperemia Neurological: no tremors, + numbness/ tingling/ burning pain to BLE, no dizziness Psychiatric: no depression, no anxiety  Objective:     BP 132/82 (BP Location: Right Arm, Patient Position: Sitting) Comment:  Receck BP manual cuff - patient states that he has not been taking any of his medications, to include BP medication.  Pulse (!) 127   Ht 6' (1.829 m)   Wt 209 lb 6.4 oz (95 kg)   BMI 28.40 kg/m   Wt Readings from Last 3 Encounters:  09/12/23 209 lb 6.4 oz (95 kg)  03/02/23 200 lb (90.7 kg)  10/25/22 204 lb 6.4 oz (92.7 kg)     BP Readings from Last 3 Encounters:  09/12/23 132/82  03/02/23 131/85  10/26/22 (!) 148/76      Physical Exam- Limited  Constitutional:  Body mass index is 28.4 kg/m. , not in acute distress, distracted state of mind Eyes:  EOMI, no exophthalmos Neck: Supple Cardiovascular: RRR, no murmurs, rubs, or gallops, no edema Respiratory: Adequate breathing efforts, no crackles, rales, rhonchi, or wheezing Musculoskeletal: no gross deformities, strength intact in all four extremities, no gross restriction of joint movements Skin:  no rashes, no hyperemia Neurological: no tremor with outstretched hands   Diabetic Foot Exam - Simple   No data filed      CMP ( most recent) CMP     Component Value Date/Time   NA 131 (L) 03/02/2023 1951   K 4.0 03/02/2023 1951   CL 95 (L) 03/02/2023 1951   CO2 27 03/02/2023 1951   GLUCOSE 376 (H) 03/02/2023 1951   BUN 21 03/07/2023 0000   CREATININE 1.3 03/07/2023 0000   CREATININE 1.09 03/02/2023 1951   CALCIUM 9.4 03/02/2023 1951   PROT 6.5 10/25/2022 2022   ALBUMIN 3.4 (L) 10/25/2022 2022   AST 16 10/25/2022 2022   ALT 15 10/25/2022 2022   ALKPHOS 198 (A) 03/07/2023 0000   BILITOT 0.7 10/25/2022 2022   EGFR 68 03/07/2023 0000   GFRNONAA >60 03/02/2023 1951     Diabetic Labs (most recent): Lab Results  Component Value Date   HGBA1C >14 03/07/2023   HGBA1C 10.6 (H) 03/04/2021   HGBA1C 10.8 (H) 03/04/2021     Lipid Panel ( most recent) Lipid Panel     Component Value Date/Time   CHOL 144 03/04/2021 0753   TRIG 145 03/07/2023 0000   HDL 32 (L) 03/04/2021 0753   CHOLHDL 4.5 03/04/2021 0753   VLDL 19  03/04/2021 0753   LDLCALC 100 03/07/2023 0000               Assessment & Plan:   1) Type 2 diabetes mellitus with hyperglycemia, with  Harn-term current use of insulin  (HCC) (Primary)  He presents today for his consultation, accompanied by his community paramedic, with no meter or logs to review.  His POCT A1c today is 11.7%, improving from last A1c of >14%.  He is not consistent with taking his medications.  He notes he took his Jordan Osborn acting insulin  this morning but has not taken the Novolog  in over 3 weeks.  He has significant neuropathy and proteinuria, but no other complications from diabetes.  He drinks root beer, water, and only eats when he is hungry.  He stays active with cutting grass.  His is UTD on eye exam, sees podiatry as well.  - Jordan Osborn has currently uncontrolled symptomatic type 2 DM since 53 years of age, with most recent A1c of 11.7 %.   -Recent labs reviewed.  - I had a Maricle discussion with him about the progressive nature of diabetes and the pathology behind its complications. -his diabetes is complicated by neuropathy, mild CKD, ? DKA and NONCOMPLIANCE and he remains at a high risk for more acute and chronic complications which include CAD, CVA, CKD, retinopathy, and neuropathy. These are all discussed in detail with him.  The following Lifestyle Medicine recommendations according to American College of Lifestyle Medicine [facility]) were discussed and offered to patient and he agrees to start the journey:  A. Whole Foods, Plant-based plate comprising of fruits and vegetables, plant-based proteins, whole-grain carbohydrates was discussed in detail with the patient.   A list for source of those nutrients were also provided to the patient.  Patient will use only water or unsweetened tea for hydration. B.  The need to stay away from risky substances including alcohol, smoking; obtaining 7 to 9 hours of restorative sleep, at least 150 minutes of moderate intensity exercise  weekly, the importance of healthy social connections,  and stress reduction techniques were discussed. C.  A full color page of  Calorie density of various food groups per pound showing examples of each food groups was provided to the patient.  - I have counseled him on diet and weight management by adopting a carbohydrate restricted/protein rich diet. Patient is encouraged to switch to unprocessed or minimally processed complex starch and increased protein intake (animal or plant source), fruits, and vegetables. -  he is advised to stick to a routine mealtimes to eat 3 meals a day and avoid unnecessary snacks (to snack only to correct hypoglycemia).   - he acknowledges that there is a room for improvement in his food and drink choices. - Suggestion is made for him to avoid simple carbohydrates from his diet including Cakes, Sweet Desserts, Ice Cream, Soda (diet and regular), Sweet Tea, Candies, Chips, Cookies, Store Bought Juices, Alcohol in Excess of 1-2 drinks a day, Artificial Sweeteners, Coffee Creamer, and Sugar-free Products. This will help patient to have more stable blood glucose profile and potentially avoid unintended weight gain.  - he has seen Jordan Osborn, RDN, CDE for diabetes education in the past.  - I have approached him with the following individualized plan to manage his diabetes and patient agrees:   -I initiated Tresiba  (since Levemir has been discontinued) at 30 units nightly (has been taking it in the morning- when he does take it).  I also encouraged him to take Novolog  8 units TID with meals if glucose is above 90 and he is eating.  We went over this with him multiple times today and he was struggling to understand, thus I am not  sure he could comprehend how to add sliding scale on this.  He is at great risk of hypoglycemia as he lacks the mental capacity to properly understand his diabetes and how it affects his body.  Avoiding hypoglycemia will be crucial in his case.   He could certainly use some help in the home, previously had aides, but he dismissed them.  Perhaps he would be a good candidate for group home or assisted living in the future.  -he is encouraged to start/continue monitoring glucose 4 times daily, before meals and before bed, and to call the clinic if he has readings less than 70 or above 300 for 3 tests in a row.  I did help him set up Dexcom account and applied a sensor for him today.  Will send order to [employer] for fulfillment.  Caregiver says she will help him to replace the sensors.  - he is warned not to take insulin  without proper monitoring per orders. - Adjustment parameters are given to him for hypo and hyperglycemia in writing.  - Specific targets for  A1c; LDL, HDL, and Triglycerides were discussed with the patient.  2) Blood Pressure /Hypertension:  his blood pressure is controlled to target.   he is advised to continue his current medications as prescribed by his PCP, although he is not sure of what meds he is taking (I asked him to bring all medications to next visit).  3) Lipids/Hyperlipidemia:    Review of his recent lipid panel from 03/07/23 showed uncontrolled LDL at 100 .  Will defer to PCP for management.  4)  Weight/Diet:  his Body mass index is 28.4 kg/m.  - he is NOT a candidate for major weight loss.  Exercise, and detailed carbohydrates information provided  -  detailed on discharge instructions.  5) Chronic Care/Health Maintenance: -he is on ACEI/ARB and Statin medications and is encouraged to initiate and continue to follow up with Ophthalmology, Dentist, Podiatrist at least yearly or according to recommendations, and advised to stay away from smoking. I have recommended yearly flu vaccine and pneumonia vaccine at least every 5 years; moderate intensity exercise for up to 150 minutes weekly; and sleep for at least 7 hours a day.  - he is advised to maintain close follow up with Doroteo Deward BIRCH, FNP for primary care  needs, as well as his other providers for optimal and coordinated care.   - Time spent in this patient care: 113 min, which was spent in counseling him about his diabetes and the rest reviewing his blood glucose logs, discussing his hypoglycemia and hyperglycemia episodes, reviewing his current and previous labs/studies (including abstraction from other facilities) and medications doses and developing a Jaggers term treatment plan based on the latest standards of care/guidelines; and documenting his care.    Please refer to Patient Instructions for Blood Glucose Monitoring and Insulin /Medications Dosing Guide in media tab for additional information. Please also refer to Patient Self Inventory in the Media tab for reviewed elements of pertinent patient history.  Jordan Osborn participated in the discussions, expressed understanding, and voiced agreement with the above plans.  All questions were answered to his satisfaction. he is encouraged to contact clinic should he have any questions or concerns prior to his return visit.     Follow up plan: - Return in about 2 weeks (around 09/26/2023) for Diabetes F/U, Bring meter and logs.    Benton Rio, [facility] [address] [place], [place] [address] Phone: [phone]  Fax: [phone]  09/12/2023, 11:59 AM

## 2023-09-12 NOTE — Telephone Encounter (Signed)
 Noted

## 2023-09-17 ENCOUNTER — Telehealth: Payer: Self-pay | Admitting: *Deleted

## 2023-09-17 ENCOUNTER — Telehealth: Payer: Self-pay | Admitting: Nurse Practitioner

## 2023-09-17 NOTE — Telephone Encounter (Signed)
 There is documentation in patient's chart.

## 2023-09-17 NOTE — Telephone Encounter (Signed)
 The after hours line RN called and said he keeps calling during off hours complaining of high blood sugars and wants a call back

## 2023-09-17 NOTE — Telephone Encounter (Signed)
 Patient called and left a message yesterday and then today he called the access nurse. He shares with them that he went to [facility] ED not feeling well. He was very weak and had a headache. Shred that his Dexcom keeps alerting him all night the patient's BS is 400. At the time that he talked with access nurse it [phone]. He admitted that he has not been taking his diabetic medications, only took Ozempic. He has not started the Aspart yet.  Per Benton Rio, NP , patient should not be on Ozempic. Only insulin . Would like to know why he has not started his Aspart yet? There are no ED notes from [facility], did he go elsewhere? Patient needs to follow providers instructions.  Talked with patient, shared Whitney's recommendation. He is concerned over his feet hurting, and chest discomfort. When ask if he went to [facility] last night, he said no. He went to the [facility] a few days ago for his feet, and heart. I shared with the patient the symptoms that he is having may certainly be due to his high blood sugars, this is why we need for him to take his insulin  only, no Ozempic. Patient is to inject Tresiba  30 units at night, he is to inject Aspart 8 units three times daily with meals, and if his glucose is above 90 and he is eating.   I strongly told the patient that per Benton, he is to bring all his medications with him to his visit next week. He stated that he would.

## 2023-09-17 NOTE — Telephone Encounter (Signed)
 Tammy talked with patient, addressed in separate encounter

## 2023-09-23 ENCOUNTER — Telehealth: Payer: Self-pay | Admitting: *Deleted

## 2023-09-23 NOTE — Telephone Encounter (Signed)
 Patient called the after hours Access Nurse twice over the weekend. The first call he shared that his blood sugar was 378 at 1200. This was on July 19,2025. He reported foot pain and difficulty moving toes, its been awhile. Report prior diagnosis of diabetic neuropathy. Patient states that he was in severe pain. He was advised to go to the ED .  Patient called the access nurse again at 7:07 PM. He told that the the phone told him to change his glucose monitor and he did. He opened up a new box and it is still not working with phone. HE shared that he was sweating. HE stated to them that he tried 1000 number but nothing working. He stated that his blood sugar was 400 earlier around 12 pm and he took insulin . He shared that his feet are hurting.  Patient was called at 11:30 am to go over what he had shared with the access nurse. No answer and no voicemail.

## 2023-09-24 ENCOUNTER — Telehealth: Payer: Self-pay | Admitting: *Deleted

## 2023-09-24 NOTE — Telephone Encounter (Signed)
 Patient has reached back out to the Access Nurse on 09/23/2023 at 10 58 pm. He shared with them that his machine went off to remove his glucose monitor and would like to speak with a nurse.  They tried to reach out to the patient and was unable to do so as his voicemail box has not been set up.  I have tried to reach the patient this morning, again no answer and was unable to leave a message as his voicemail box has not been set up. Patient currently has an appointment on 09/26/23 at 10 am.

## 2023-09-26 ENCOUNTER — Telehealth: Payer: Self-pay | Admitting: *Deleted

## 2023-09-26 ENCOUNTER — Ambulatory Visit (INDEPENDENT_AMBULATORY_CARE_PROVIDER_SITE_OTHER): Admitting: Nurse Practitioner

## 2023-09-26 ENCOUNTER — Encounter: Payer: Self-pay | Admitting: Nurse Practitioner

## 2023-09-26 VITALS — BP 138/88 | HR 106 | Ht 72.0 in | Wt 201.0 lb

## 2023-09-26 DIAGNOSIS — Z794 Long term (current) use of insulin: Secondary | ICD-10-CM | POA: Diagnosis not present

## 2023-09-26 DIAGNOSIS — E559 Vitamin D deficiency, unspecified: Secondary | ICD-10-CM

## 2023-09-26 DIAGNOSIS — E782 Mixed hyperlipidemia: Secondary | ICD-10-CM

## 2023-09-26 DIAGNOSIS — Z7984 Long term (current) use of oral hypoglycemic drugs: Secondary | ICD-10-CM

## 2023-09-26 DIAGNOSIS — E1165 Type 2 diabetes mellitus with hyperglycemia: Secondary | ICD-10-CM

## 2023-09-26 DIAGNOSIS — I10 Essential (primary) hypertension: Secondary | ICD-10-CM

## 2023-09-26 NOTE — Telephone Encounter (Signed)
 Thanks for the update

## 2023-09-26 NOTE — Telephone Encounter (Signed)
 Patient called and left a message that he was real sick, sick as a dog. At the time of his call he said that his blood sugar was 269.  Patient was called and he shared that he was currently in the hospital at [facility]. After leaving office today, per the patient , a good [employer]aritan called him and ask that he come and cut down the banana trees,and spray around her house. He did . While there he got really sick . Really bad chest pain. He went home and called EMS.  I ask had he eaten his lunch and injected his insulin  as directed, he responded , NO.  He will share with is the outcome of his ED visit.

## 2023-09-26 NOTE — Progress Notes (Signed)
 Endocrinology Follow Up Note       09/26/2023, 10:46 AM   Subjective:    Patient ID: Jordan Osborn, male    DOB: Mar 25, 1970.  Jordan Osborn is being seen in follow up after being seen in consultation for management of currently uncontrolled symptomatic diabetes requested by  Doroteo Deward BIRCH, FNP.   Past Medical History:  Diagnosis Date   Diabetes mellitus without complication (HCC)    Hypertension     Past Surgical History:  Procedure Laterality Date   CHOLECYSTECTOMY     HERNIA REPAIR      Social History   Socioeconomic History   Marital status: Single    Spouse name: Not on file   Number of children: Not on file   Years of education: Not on file   Highest education level: Not on file  Occupational History   Not on file  Tobacco Use   Smoking status: Never   Smokeless tobacco: Never  Vaping Use   Vaping status: Never Used  Substance and Sexual Activity   Alcohol use: Not Currently    Comment: occasionally   Drug use: No   Sexual activity: Not on file  Other Topics Concern   Not on file  Social History Narrative   Not on file   Social Drivers of Health   Financial Resource Strain: Low Risk  (07/09/2023)   Received from [facility]   Overall Financial Resource Strain (CARDIA)    Difficulty of Paying Living Expenses: Not hard at all  Food Insecurity: No Food Insecurity (07/09/2023)   Received from [facility]   Hunger Vital Sign    Within the past 12 months, you worried that your food would run out before you got the money to buy more.: Never true    Within the past 12 months, the food you bought just didn't last and you didn't have money to get more.: Never true  Transportation Needs: No Transportation Needs (07/09/2023)   Received from [facility] - Transportation    In the past 12 months, has lack of transportation kept you from medical  appointments or from getting medications?: No    Lack of Transportation (Non-Medical): No  Physical Activity: Not on file  Stress: Not on file  Social Connections: Not on file    History reviewed. No pertinent family history.  Outpatient Encounter Medications as of 09/26/2023  Medication Sig   atorvastatin (LIPITOR) 40 MG tablet Take 40 mg by mouth daily.   carvedilol (COREG) 3.125 MG tablet Take 3.125 mg by mouth 2 (two) times daily.   DULoxetine (CYMBALTA) 30 MG capsule Take 60 mg by mouth daily.   gabapentin (NEURONTIN) 300 MG capsule Take 300 mg by mouth 2 (two) times daily. (Patient taking differently: Take 600 mg by mouth 3 (three) times daily.)   Insulin  Aspart FlexPen (NOVOLOG ) 100 UNIT/ML Inject 8 Units into the skin 3 (three) times daily with meals.   insulin  degludec (TRESIBA  FLEXTOUCH) 100 UNIT/ML FlexTouch Pen Inject 30 Units into the skin at bedtime. (Patient taking differently: Inject 8-15 Units into the skin at bedtime.)   lisinopril (ZESTRIL) 20 MG  tablet Take 20 mg by mouth daily.   nitroGLYCERIN (NITROSTAT) 0.4 MG SL tablet Place 0.4 mg under the tongue every 5 (five) minutes as needed for chest pain.   pantoprazole  (PROTONIX ) 40 MG tablet Take 1 tablet (40 mg total) by mouth 2 (two) times daily.   [DISCONTINUED] metFORMIN  (GLUCOPHAGE ) 500 MG tablet Take 500 mg by mouth 2 (two) times daily.   [DISCONTINUED] pioglitazone (ACTOS) 15 MG tablet Take 15 mg by mouth daily.   amLODipine  (NORVASC ) 5 MG tablet Take 1 tablet (5 mg total) by mouth daily. (Patient not taking: Reported on 09/12/2023)   aspirin 81 MG chewable tablet Chew 162 mg by mouth daily. (Patient not taking: Reported on 09/12/2023)   azelastine (ASTELIN) 0.1 % nasal spray Place into both nostrils. (Patient not taking: Reported on 10/20/2021)   benzonatate  (TESSALON ) 100 MG capsule Take 1 capsule (100 mg total) by mouth every 8 (eight) hours. (Patient not taking: Reported on 09/12/2023)   glucose monitoring kit  (FREESTYLE) monitoring kit 1 each by Does not apply route as needed for other. (Patient not taking: Reported on 09/12/2023)   labetalol (NORMODYNE) 100 MG tablet Take by mouth daily. (Patient not taking: Reported on 10/20/2021)   losartan  (COZAAR ) 50 MG tablet Take 1 tablet (50 mg total) by mouth daily. (Patient not taking: Reported on 09/12/2023)   pregabalin (LYRICA) 100 MG capsule Take 100 mg by mouth 2 (two) times daily. (Patient not taking: Reported on 09/12/2023)   traMADol (ULTRAM) 50 MG tablet Take 50 mg by mouth every 4 (four) hours as needed. (Patient not taking: Reported on 09/12/2023)   No facility-administered encounter medications on file as of 09/26/2023.    ALLERGIES: Allergies  Allergen Reactions   Benadryl [Diphenhydramine Hcl]    Morphine  And Codeine    Omnipaque  [Iohexol ] Hives and Itching    Patient experienced hive and severe itching whole body after Omnipaque  injection.  Solumedrol and pepsed administered in ED after exam    VACCINATION STATUS: Immunization History  Administered Date(s) Administered   Influenza Split 12/11/2011   Influenza,inj,Quad PF,6+ Mos 03/05/2021   Moderna Covid-19 Vaccine Bivalent Booster 55yrs & up 03/06/2021    Diabetes He presents for his follow-up diabetic visit. He has type 2 diabetes mellitus. Onset time: diagnosed at approx age of 72. His disease course has been improving. There are no hypoglycemic associated symptoms. Associated symptoms include blurred vision, chest pain, fatigue, foot paresthesias, polydipsia and polyuria. Pertinent negatives for diabetes include no weight loss. There are no hypoglycemic complications. Symptoms are stable. Diabetic complications include nephropathy and peripheral neuropathy. (Multiple instances of DKA) Risk factors for coronary artery disease include diabetes mellitus, dyslipidemia, family history, male sex and hypertension. Current diabetic treatment includes intensive insulin  program (he brought his meds  with him, has been taking Ozempic, Actos, and Metformin  even though discontinued at last visit). He is compliant with treatment some of the time. His weight is increasing steadily. He is following a generally unhealthy diet. When asked about meal planning, he reported none. He has not had a previous visit with a dietitian. He participates in exercise intermittently. (He presents today for his consultation, accompanied by his community paramedic, with his CGM showing gross hyperglycemia overall, however slowly improving.  He was not due for another A1c today.  He brought his meds with him, it appears he is still taking Metformin  (which we discontinued at last visit) and is also taking Actos.  He admits he has not been taking his Tresiba , prescribed at  last visit, has only been using the Novolog  per the sliding scale from his previous doctor.  Analysis of his CGM shows TIR 1%, TAR 99%, TBR 0% with a GMI of 11%.) An ACE inhibitor/angiotensin II receptor blocker is being taken. He sees a podiatrist.Eye exam is current.     Review of systems  Constitutional: + increasing body weight,  current Body mass index is 27.26 kg/m. , no fatigue, no subjective hyperthermia, no subjective hypothermia Eyes: no blurry vision, no xerophthalmia ENT: no sore throat, no nodules palpated in throat, no dysphagia/odynophagia, no hoarseness Cardiovascular: no chest pain, no shortness of breath, no palpitations, no leg swelling Respiratory: no cough, no shortness of breath Gastrointestinal: no nausea/vomiting/diarrhea Musculoskeletal: no muscle/joint aches Skin: no rashes, no hyperemia Neurological: no tremors, + numbness/tingling/burning pain to BLE, no dizziness Psychiatric: no depression, no anxiety  Objective:     BP 138/88 (BP Location: Right Arm, Patient Position: Sitting, Cuff Size: Large)   Pulse (!) 106   Ht 6' (1.829 m)   Wt 201 lb (91.2 kg)   BMI 27.26 kg/m   Wt Readings from Last 3 Encounters:  09/26/23  201 lb (91.2 kg)  09/12/23 209 lb 6.4 oz (95 kg)  03/02/23 200 lb (90.7 kg)     BP Readings from Last 3 Encounters:  09/26/23 138/88  09/12/23 132/82  03/02/23 131/85      Physical Exam- Limited  Constitutional:  Body mass index is 27.26 kg/m. , not in acute distress, distracted state of mind Eyes:  EOMI, no exophthalmos Musculoskeletal: no gross deformities, strength intact in all four extremities, no gross restriction of joint movements Skin:  no rashes, no hyperemia Neurological: no tremor with outstretched hands   Diabetic Foot Exam - Simple   No data filed      CMP ( most recent) CMP     Component Value Date/Time   NA 131 (L) 03/02/2023 1951   K 4.0 03/02/2023 1951   CL 95 (L) 03/02/2023 1951   CO2 27 03/02/2023 1951   GLUCOSE 376 (H) 03/02/2023 1951   BUN 21 03/07/2023 0000   CREATININE 1.3 03/07/2023 0000   CREATININE 1.09 03/02/2023 1951   CALCIUM 9.4 03/02/2023 1951   PROT 6.5 10/25/2022 2022   ALBUMIN 3.4 (L) 10/25/2022 2022   AST 16 10/25/2022 2022   ALT 15 10/25/2022 2022   ALKPHOS 198 (A) 03/07/2023 0000   BILITOT 0.7 10/25/2022 2022   EGFR 68 03/07/2023 0000   GFRNONAA >60 03/02/2023 1951     Diabetic Labs (most recent): Lab Results  Component Value Date   HGBA1C 11.7 (A) 09/12/2023   HGBA1C >14 03/07/2023   HGBA1C 10.6 (H) 03/04/2021     Lipid Panel ( most recent) Lipid Panel     Component Value Date/Time   CHOL 144 03/04/2021 0753   TRIG 145 03/07/2023 0000   HDL 32 (L) 03/04/2021 0753   CHOLHDL 4.5 03/04/2021 0753   VLDL 19 03/04/2021 0753   LDLCALC 100 03/07/2023 0000               Assessment & Plan:   1) Type 2 diabetes mellitus with hyperglycemia, with Jordan Osborn-term current use of insulin  (HCC) (Primary)  He presents today for his consultation, accompanied by his community paramedic, with his CGM showing gross hyperglycemia overall, however slowly improving.  He was not due for another A1c today.  He brought his meds  with him, it appears he is still taking Metformin  (which we discontinued at last  visit) and is also taking Actos.  He admits he has not been taking his Tresiba , prescribed at last visit, has only been using the Novolog  per the sliding scale from his previous doctor.  Analysis of his CGM shows TIR 1%, TAR 99%, TBR 0% with a GMI of 11%.  He has called the after hours line multiple times for various reasons but never answered our calls when we were returning them.  His caregiver discovered his phone was on do not disturb.  - Jordan Osborn has currently uncontrolled symptomatic type 2 DM since 53 years of age.   -Recent labs reviewed.  - I had a Jordan Osborn discussion with him about the progressive nature of diabetes and the pathology behind its complications. -his diabetes is complicated by neuropathy, mild CKD, ? DKA and NONCOMPLIANCE and he remains at a high risk for more acute and chronic complications which include CAD, CVA, CKD, retinopathy, and neuropathy. These are all discussed in detail with him.  The following Lifestyle Medicine recommendations according to American College of Lifestyle Medicine [facility]) were discussed and offered to patient and he agrees to start the journey:  A. Whole Foods, Plant-based plate comprising of fruits and vegetables, plant-based proteins, whole-grain carbohydrates was discussed in detail with the patient.   A list for source of those nutrients were also provided to the patient.  Patient will use only water or unsweetened tea for hydration. B.  The need to stay away from risky substances including alcohol, smoking; obtaining 7 to 9 hours of restorative sleep, at least 150 minutes of moderate intensity exercise weekly, the importance of healthy social connections,  and stress reduction techniques were discussed. C.  A full color page of  Calorie density of various food groups per pound showing examples of each food groups was provided to the patient.  - Nutritional counseling  repeated at each appointment due to patients tendency to fall back in to old habits.  - The patient admits there is a room for improvement in their diet and drink choices. -  Suggestion is made for the patient to avoid simple carbohydrates from their diet including Cakes, Sweet Desserts / Pastries, Ice Cream, Soda (diet and regular), Sweet Tea, Candies, Chips, Cookies, Sweet Pastries, Store Bought Juices, Alcohol in Excess of 1-2 drinks a day, Artificial Sweeteners, Coffee Creamer, and Sugar-free Products. This will help patient to have stable blood glucose profile and potentially avoid unintended weight gain.   - I encouraged the patient to switch to unprocessed or minimally processed complex starch and increased protein intake (animal or plant source), fruits, and vegetables.   - Patient is advised to stick to a routine mealtimes to eat 3 meals a day and avoid unnecessary snacks (to snack only to correct hypoglycemia).  - he has seen Jordan Osborn, Jordan Osborn, Jordan Osborn for diabetes education in the past.  - I have approached him with the following individualized plan to manage his diabetes and patient agrees:   -I went over his medications today.  The only 2 medications he needs for his diabetes at this time is his Tresiba  30 units nightly and his Novolog  8 units TID with meals.  I had my nurse call the pharmacy to stop the Metformin  and Actos from being put into his pill packs.  I also wrote out a schedule for his insulin  on a separate paper for him to refer to.   He is at great risk of hypoglycemia as he lacks the mental capacity to properly understand  his diabetes and how it affects his body.  Avoiding hypoglycemia will be crucial in his case.  He could certainly use some help in the home, previously had aides, but he dismissed them.  Perhaps he would be a good candidate for group home or assisted living in the future.  -he is encouraged to continue monitoring glucose 4 times daily (using CGM), before  meals and before bed.  He never heard from [employer] (likely due to his phone being on DND). Will send notification to [employer] for them to reach out to him.  Caregiver says she will help him to replace the sensors (he had EMS help him recently).  - he is warned not to take insulin  without proper monitoring per orders. - Adjustment parameters are given to him for hypo and hyperglycemia in writing.  - Specific targets for  A1c; LDL, HDL, and Triglycerides were discussed with the patient.  2) Blood Pressure /Hypertension:  his blood pressure is controlled to target.   he is advised to continue his current medications as prescribed by his PCP, although he is not sure of what meds he is taking (I asked him to bring all medications to next visit).  3) Lipids/Hyperlipidemia:    Review of his recent lipid panel from 03/07/23 showed uncontrolled LDL at 100 .  Will defer to PCP for management.  4)  Weight/Diet:  his Body mass index is 27.26 kg/m.  - he is NOT a candidate for major weight loss.  Exercise, and detailed carbohydrates information provided  -  detailed on discharge instructions.  5) Chronic Care/Health Maintenance: -he is on ACEI/ARB and Statin medications and is encouraged to initiate and continue to follow up with Ophthalmology, Dentist, Podiatrist at least yearly or according to recommendations, and advised to stay away from smoking. I have recommended yearly flu vaccine and pneumonia vaccine at least every 5 years; moderate intensity exercise for up to 150 minutes weekly; and sleep for at least 7 hours a day.  - he is advised to maintain close follow up with Doroteo Deward BIRCH, FNP for primary care needs, as well as his other providers for optimal and coordinated care.     I spent  51  minutes in the care of the patient today including review of labs from CMP, Lipids, Thyroid Function, Hematology (current and previous including abstractions from other facilities); face-to-face time discussing   his blood glucose readings/logs, discussing hypoglycemia and hyperglycemia episodes and symptoms, medications doses, his options of short and Keep term treatment based on the latest standards of care / guidelines;  discussion about incorporating lifestyle medicine;  and documenting the encounter. Risk reduction counseling performed per USPSTF guidelines to reduce obesity and cardiovascular risk factors.     Please refer to Patient Instructions for Blood Glucose Monitoring and Insulin /Medications Dosing Guide  in media tab for additional information. Please  also refer to  Patient Self Inventory in the Media  tab for reviewed elements of pertinent patient history.  Jordan Osborn participated in the discussions, expressed understanding, and voiced agreement with the above plans.  All questions were answered to his satisfaction. he is encouraged to contact clinic should he have any questions or concerns prior to his return visit.     Follow up plan: - Return in about 1 month (around 10/27/2023) for Diabetes F/U with A1c in office, No previsit labs, Bring meter and logs.   Benton Rio, [facility] [address] [place], [place] [address] Phone: [phone]  Fax: [phone]  09/26/2023, 10:46 AM

## 2023-09-27 ENCOUNTER — Telehealth: Payer: Self-pay | Admitting: *Deleted

## 2023-09-27 NOTE — Telephone Encounter (Signed)
 Patient left a voice mail. He was asking for a call back. In the message he wanted us  to know that he had finally got home from [facility], after being taken here by ambulance yesterday. He ask for a call back.  Patient was called back at 11:39 am. No answer and his voicemail has not been set up.

## 2023-10-02 ENCOUNTER — Ambulatory Visit: Payer: Self-pay

## 2023-10-02 NOTE — Telephone Encounter (Signed)
 Pt called stating the ambulance won't come pick me up anymore and told me to stop calling.  Pt states that he is having constant 10/10 CP, SOB, sweating. Advised to call 911, again tells RN I can't they told me I can't. Pt is poor historian and does not directly answer NT questions.  Pt disconnected call when advised that the individual he was looking to speak to is not a name familiar to the writer.  FYI Only or Action Required?: FYI only for provider.  Called Nurse Triage reporting Chest Pain.  Symptoms began several months ago.  Interventions attempted: Nothing.  Symptoms are: unchanged.  Triage Disposition: Call EMS 911 Now  Patient/caregiver understands and will follow disposition?:  Copied from CRM #[id number]. Topic: Clinical - Red Word Triage >> Oct 02, 2023  4:37 PM Tiffini S wrote: Red Word that prompted transfer to Nurse Triage: Patient was outside working in the yard and started feeling bad, hurting in pain on the left side of the chest under the breast area. Patient was seen by the paramedics yesterday. Transferred call to triage nurse. Reason for Disposition  [1] Chest pain lasts > 5 minutes AND [2] age > 28  Answer Assessment - Initial Assessment Questions 1. LOCATION: Where does it hurt?       L side of chest  2. RADIATION: Does the pain go anywhere else? (e.g., into neck, jaw, arms, back)     denies 3. ONSET: When did the chest pain begin? (Minutes, hours or days)      months 4. PATTERN: Does the pain come and go, or has it been constant since it started?  Does it get worse with exertion?      constant 6. SEVERITY: How bad is the pain?  (e.g., Scale 1-10; mild, moderate, or severe)     10 7. CARDIAC RISK FACTORS: Do you have any history of heart problems or risk factors for heart disease? (e.g., angina, prior heart attack; diabetes, high blood pressure, high cholesterol, smoker, or strong family history of heart disease)     Pt states that he takes  NTG, pt states I think I had 2 heart attacks 10. OTHER SYMPTOMS: Do you have any other symptoms? (e.g., dizziness, nausea, vomiting, sweating, fever, difficulty breathing, cough)       Sweating, SOB, I feel like I need oxygen.  Pt states that he had pain in his L arm pain.  Protocols used: Chest Pain-A-AH

## 2023-10-02 NOTE — Telephone Encounter (Signed)
 Patient called the after hours line during lunch hour. He shared that he wanted to speak to [place]. That he had tried the day before. His glucometer. It has been 15 days and he had to go ahead and change it. He wants to know if that is okay. He states his phone did not alert him, He agreed to a chart for a triage nurse since office is at lunch. Patient reports that he was feeling okay, that he had gone to the ED twice in the last 3 weeks..  At 4:48 pm patient was called, no answer and his voicemail is not set up to receive messages.

## 2023-10-03 ENCOUNTER — Telehealth: Payer: Self-pay | Admitting: *Deleted

## 2023-10-03 NOTE — Telephone Encounter (Signed)
 Patient left another message after hours on my voicemail. Asking for a call back, stated he felt rough.  Patient was called at 9:51 am. Voicemail came on , unable to leave a message because the patient does not have his voicemail set up. We will try again .

## 2023-10-21 ENCOUNTER — Telehealth: Payer: Self-pay | Admitting: *Deleted

## 2023-10-21 NOTE — Telephone Encounter (Signed)
 Patient has left 2 messages with the after hours line on Saturday about his Dexcom G7 having came off and he was not getting readings. He also shared that his feet were hurting.  Attempted to call patient 2 different times and both times got the message that his voicemail was not set up ,therefore unable to leave the patient a message.

## 2023-10-28 ENCOUNTER — Telehealth: Payer: Self-pay | Admitting: *Deleted

## 2023-10-28 NOTE — Telephone Encounter (Signed)
 I have called Jon the person who has helped him. I have left a detailed message for her and ask that she please call our office.

## 2023-10-28 NOTE — Telephone Encounter (Signed)
 Patient called the after hours line over the weekend. His complaint was he was vomiting blood. He tasted blood and spit and saw some blood once. He reports that he had eaten popcorn earlier, and he does not fell any sores or pain in mouth.  Called the patient and actually got him. He shares that he has not bleed anymore from his mouth. He did not seek any medical attention as he was told not call EMS unless it was absolutely necessary. Patient chose not too.  He has tried to reach [place] who has been coming with him to his appointments, he has not heard any response from her if he calls or if it is a text.  Patient has had no more bleeding. He complains of his feet hurting him so badly.   I ask the patient if he was checking his BS with his regular meter. He shared that he had throw all of that away because he lives in a very small place , and it was piling up. He shared that he had been putting on his sensor and it pared with his telephone with no trouble but this last time it would not work.  Patient currently has appointment on 11/07/23. Patient will not have two weeks of readings, to be discussed with Whitney. Patient provided the telephone number for Jon, I will attempt to reach her. As she makes sure that the patient's transportation with transport is arranged. Patient was also advised that if he could be seen at this appointment, he would need to bring his phone,reader, sensor so that we may be able to set it up. We will make him aware.

## 2023-11-01 ENCOUNTER — Telehealth: Payer: Self-pay

## 2023-11-01 NOTE — Telephone Encounter (Signed)
 Tried to return pt's call, did not receive an answer and was unable to leave a message.

## 2023-11-07 ENCOUNTER — Ambulatory Visit: Admitting: Nurse Practitioner

## 2023-11-07 DIAGNOSIS — E559 Vitamin D deficiency, unspecified: Secondary | ICD-10-CM

## 2023-11-07 DIAGNOSIS — I10 Essential (primary) hypertension: Secondary | ICD-10-CM

## 2023-11-07 DIAGNOSIS — E1165 Type 2 diabetes mellitus with hyperglycemia: Secondary | ICD-10-CM

## 2023-11-07 DIAGNOSIS — Z794 Long term (current) use of insulin: Secondary | ICD-10-CM

## 2023-11-07 DIAGNOSIS — E782 Mixed hyperlipidemia: Secondary | ICD-10-CM

## 2023-11-11 ENCOUNTER — Telehealth: Payer: Self-pay | Admitting: *Deleted

## 2023-11-11 ENCOUNTER — Other Ambulatory Visit: Payer: Self-pay | Admitting: Nurse Practitioner

## 2023-11-11 DIAGNOSIS — E1165 Type 2 diabetes mellitus with hyperglycemia: Secondary | ICD-10-CM

## 2023-11-11 NOTE — Telephone Encounter (Signed)
 Patient called after hours over the weekend. He was sharing that his feet were getting cold, like they were in a deep freeze. His hip is hurting. Glucometer not working. He was advised to go to the ED.  I called the patient and he shared the same symptoms as noted above. We were able to look at his CGM data, readings are HIGH. Per Benton , patient is to start administering his insulin  as he as been directed. Tresiba  30 units at bedtime, Novolog  8 units three times a day with meals. He said, that he did not have any Tresiba . I called the [employer] and they do have a script for it and they are going to get it ready for him and deliver it today or tomorrow. I advised the patient the importance of him eating correctly, avoiding sugary drinks and any thing that read sugar free. Drink water. Patient voiced back that he would.

## 2023-11-14 ENCOUNTER — Telehealth: Payer: Self-pay | Admitting: *Deleted

## 2023-11-14 NOTE — Telephone Encounter (Signed)
 Patient has left message asking for a call back. Call was returned this morning, no answer. Will try call again.  Talked with the patient. He shares that his feet and hip is about to kill him in pain. Patient was advised to call his PCP to see if he could be seen. Patient states that he sees Doctor Deward at [facility] in [place]. I looked up the number for the patient and he said, that he was going to call them.

## 2023-11-21 ENCOUNTER — Ambulatory Visit: Admitting: Nurse Practitioner

## 2023-11-21 ENCOUNTER — Telehealth: Payer: Self-pay | Admitting: *Deleted

## 2023-11-21 DIAGNOSIS — E782 Mixed hyperlipidemia: Secondary | ICD-10-CM

## 2023-11-21 DIAGNOSIS — E559 Vitamin D deficiency, unspecified: Secondary | ICD-10-CM

## 2023-11-21 DIAGNOSIS — I10 Essential (primary) hypertension: Secondary | ICD-10-CM

## 2023-11-21 DIAGNOSIS — Z794 Long term (current) use of insulin: Secondary | ICD-10-CM

## 2023-11-21 DIAGNOSIS — E1165 Type 2 diabetes mellitus with hyperglycemia: Secondary | ICD-10-CM

## 2023-11-21 NOTE — Telephone Encounter (Signed)
 Patient has called the office asking to please see and talk with Whitney. Patient had appointment to be seen today but he was in the hospital, (ED).  I returned his call. It went straight to a message that his voicemail box was full and a message cannot be left, return your call later.

## 2023-11-26 ENCOUNTER — Telehealth: Payer: Self-pay | Admitting: *Deleted

## 2023-11-26 NOTE — Telephone Encounter (Signed)
 Returned call to the patient, phone was disconnected. Will try again.

## 2023-11-26 NOTE — Telephone Encounter (Signed)
 I cannot help him if he does not take the medication as I have prescribed consistently.  I tried putting in a SW consult but it got denied as he does not see [facility] network PCP.  I did send fax to PCP regarding my concerns with this patients ability to properly manage their medical conditions but have not yet received any feedback.

## 2023-11-26 NOTE — Telephone Encounter (Signed)
 Patient has called twice. The first time  was 11/22/2023. He states that he is having right hip pain. He did go to the hospital on Thursday for the hip pain and heaviness in his feet. He also has appointment with our office at that time. His feet are continuing to swell. It was mentioned to him that medication could be causing this swelling. He states that they stopped his insulin  in the ED, his glucometer also fell in the floor. States that sometimes he has headache, but he did not have a headache at the time of this call.  Attempts have been made to return calls to the patient but he does not answer. It gives a message and we are unable to leave a message.  Patient has called again, he is asking that Ms. Whitney please call him, that he would appreciate it.  I have printed out his data, from 11/10/2023-11/23/2023.

## 2024-01-07 ENCOUNTER — Other Ambulatory Visit: Payer: Self-pay | Admitting: Nurse Practitioner

## 2024-02-17 ENCOUNTER — Encounter (HOSPITAL_COMMUNITY): Payer: Self-pay

## 2024-02-17 ENCOUNTER — Emergency Department (HOSPITAL_COMMUNITY)
Admission: EM | Admit: 2024-02-17 | Discharge: 2024-02-17 | Disposition: A | Discharge status: Home / Self Care | Attending: Emergency Medicine | Admitting: Emergency Medicine

## 2024-02-17 ENCOUNTER — Emergency Department (HOSPITAL_COMMUNITY)
Admission: EM | Admit: 2024-02-17 | Discharge: 2024-02-17 | Disposition: A | Discharge status: Home / Self Care | Source: Home / Self Care | Attending: Emergency Medicine | Admitting: Emergency Medicine

## 2024-02-17 ENCOUNTER — Other Ambulatory Visit: Payer: Self-pay

## 2024-02-17 ENCOUNTER — Emergency Department (HOSPITAL_COMMUNITY)

## 2024-02-17 DIAGNOSIS — E119 Type 2 diabetes mellitus without complications: Secondary | ICD-10-CM | POA: Insufficient documentation

## 2024-02-17 DIAGNOSIS — I10 Essential (primary) hypertension: Secondary | ICD-10-CM | POA: Insufficient documentation

## 2024-02-17 DIAGNOSIS — M79671 Pain in right foot: Secondary | ICD-10-CM | POA: Insufficient documentation

## 2024-02-17 DIAGNOSIS — Z794 Long term (current) use of insulin: Secondary | ICD-10-CM | POA: Insufficient documentation

## 2024-02-17 DIAGNOSIS — S9032XA Contusion of left foot, initial encounter: Secondary | ICD-10-CM | POA: Diagnosis present

## 2024-02-17 DIAGNOSIS — Y9302 Activity, running: Secondary | ICD-10-CM | POA: Insufficient documentation

## 2024-02-17 DIAGNOSIS — Y92512 Supermarket, store or market as the place of occurrence of the external cause: Secondary | ICD-10-CM | POA: Insufficient documentation

## 2024-02-17 DIAGNOSIS — Y92410 Unspecified street and highway as the place of occurrence of the external cause: Secondary | ICD-10-CM | POA: Insufficient documentation

## 2024-02-17 DIAGNOSIS — S9030XA Contusion of unspecified foot, initial encounter: Secondary | ICD-10-CM

## 2024-02-17 DIAGNOSIS — Z79899 Other long term (current) drug therapy: Secondary | ICD-10-CM | POA: Insufficient documentation

## 2024-02-17 DIAGNOSIS — Z7982 Long term (current) use of aspirin: Secondary | ICD-10-CM | POA: Diagnosis not present

## 2024-02-17 DIAGNOSIS — Z7984 Long term (current) use of oral hypoglycemic drugs: Secondary | ICD-10-CM | POA: Insufficient documentation

## 2024-02-17 LAB — CBC WITH DIFFERENTIAL/PLATELET
Abs Immature Granulocytes: 0.07 K/uL (ref 0.00–0.07)
Basophils Absolute: 0 K/uL (ref 0.0–0.1)
Basophils Relative: 0 %
Eosinophils Absolute: 0 K/uL (ref 0.0–0.5)
Eosinophils Relative: 0 %
HCT: 51.8 % (ref 39.0–52.0)
Hemoglobin: 17.4 g/dL — ABNORMAL HIGH (ref 13.0–17.0)
Immature Granulocytes: 1 %
Lymphocytes Relative: 15 %
Lymphs Abs: 1.4 K/uL (ref 0.7–4.0)
MCH: 30 pg (ref 26.0–34.0)
MCHC: 33.6 g/dL (ref 30.0–36.0)
MCV: 89.3 fL (ref 80.0–100.0)
Monocytes Absolute: 0.6 K/uL (ref 0.1–1.0)
Monocytes Relative: 6 %
Neutro Abs: 7.3 K/uL (ref 1.7–7.7)
Neutrophils Relative %: 78 %
Platelets: 234 K/uL (ref 150–400)
RBC: 5.8 MIL/uL (ref 4.22–5.81)
RDW: 12.7 % (ref 11.5–15.5)
WBC: 9.4 K/uL (ref 4.0–10.5)
nRBC: 0 % (ref 0.0–0.2)

## 2024-02-17 LAB — COMPREHENSIVE METABOLIC PANEL WITH GFR
ALT: 15 U/L (ref 0–44)
AST: 19 U/L (ref 15–41)
Albumin: 4.6 g/dL (ref 3.5–5.0)
Alkaline Phosphatase: 168 U/L — ABNORMAL HIGH (ref 38–126)
Anion gap: 19 — ABNORMAL HIGH (ref 5–15)
BUN: 15 mg/dL (ref 6–20)
CO2: 24 mmol/L (ref 22–32)
Calcium: 10.1 mg/dL (ref 8.9–10.3)
Chloride: 91 mmol/L — ABNORMAL LOW (ref 98–111)
Creatinine, Ser: 1.1 mg/dL (ref 0.61–1.24)
GFR, Estimated: >60 mL/min (ref >60)
Glucose, Bld: 409 mg/dL — ABNORMAL HIGH (ref 70–99)
Potassium: 4.2 mmol/L (ref 3.5–5.1)
Sodium: 134 mmol/L — ABNORMAL LOW (ref 135–145)
Total Bilirubin: 0.7 mg/dL (ref 0.0–1.2)
Total Protein: 7.9 g/dL (ref 6.5–8.1)

## 2024-02-17 LAB — BASIC METABOLIC PANEL WITH GFR
Anion gap: 17 — ABNORMAL HIGH (ref 5–15)
BUN: 16 mg/dL (ref 6–20)
CO2: 24 mmol/L (ref 22–32)
Calcium: 9.7 mg/dL (ref 8.9–10.3)
Chloride: 95 mmol/L — ABNORMAL LOW (ref 98–111)
Creatinine, Ser: 1.07 mg/dL (ref 0.61–1.24)
GFR, Estimated: >60 mL/min (ref >60)
Glucose, Bld: 335 mg/dL — ABNORMAL HIGH (ref 70–99)
Potassium: 4.5 mmol/L (ref 3.5–5.1)
Sodium: 135 mmol/L (ref 135–145)

## 2024-02-17 MED ORDER — NAPROXEN 500 MG PO TABS: 500.0000 mg | ORAL_TABLET | Freq: Two times a day (BID) | ORAL | 0 refills | Status: DC

## 2024-02-17 MED ORDER — INSULIN ASPART 100 UNIT/ML IJ SOLN
10.0000 [IU] | Freq: Once | INTRAMUSCULAR | Status: AC
  Administered 2024-02-17: 14:00:00 10 [IU] via SUBCUTANEOUS
  Filled 2024-02-17: qty 1

## 2024-02-17 MED ORDER — SODIUM CHLORIDE 0.9 % IV BOLUS
1000.0000 mL | Freq: Once | INTRAVENOUS | Status: AC
  Administered 2024-02-17: 14:00:00 1000 mL via INTRAVENOUS

## 2024-02-17 MED ORDER — KETOROLAC TROMETHAMINE 15 MG/ML IJ SOLN
15.0000 mg | Freq: Once | INTRAMUSCULAR | Status: AC
  Administered 2024-02-17: 20:00:00 15 mg via INTRAMUSCULAR
  Filled 2024-02-17: qty 1

## 2024-02-17 NOTE — ED Notes (Signed)
 Ace wrap applied per vo dr Zammit

## 2024-02-17 NOTE — ED Notes (Signed)
Crackers and water given.

## 2024-02-17 NOTE — Discharge Instructions (Signed)
 Follow-up with Dr. Margrette for your foot injury.  You have an abrasion and a contusion to the right foot.  There is a possible small fracture to the little toe on the right foot.  You can take Tylenol  for pain
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
## 2024-02-17 NOTE — ED Triage Notes (Addendum)
 Pt was just d/c with no ride home. States foot still hurts and that he is a diabetic.

## 2024-02-17 NOTE — ED Notes (Signed)
 Received report from Rock Darra PEAK. Pt resting. Nad. Wanting to eat. Edp aware
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
## 2024-02-17 NOTE — Discharge Instructions (Signed)
 May take naproxen  twice a day as needed for pain.  Elevate foot to help with swelling.  Wear Ace wrap on the foot to help with swelling as well.  Follow-up with PCP or orthopedics for further evaluation management of any continued pain.  Return to ED sooner if any symptoms worsen including severe discoloration to the foot/leg or inability to walk.

## 2024-03-21 ENCOUNTER — Emergency Department (HOSPITAL_COMMUNITY)
Admission: EM | Admit: 2024-03-21 | Discharge: 2024-03-21 | Disposition: A | Discharge status: Home / Self Care | Attending: Emergency Medicine | Admitting: Emergency Medicine

## 2024-03-21 ENCOUNTER — Emergency Department (HOSPITAL_COMMUNITY)

## 2024-03-21 DIAGNOSIS — R739 Hyperglycemia, unspecified: Secondary | ICD-10-CM | POA: Diagnosis present

## 2024-03-21 DIAGNOSIS — Z7982 Long term (current) use of aspirin: Secondary | ICD-10-CM | POA: Insufficient documentation

## 2024-03-21 DIAGNOSIS — Z794 Long term (current) use of insulin: Secondary | ICD-10-CM | POA: Diagnosis not present

## 2024-03-21 DIAGNOSIS — R109 Unspecified abdominal pain: Secondary | ICD-10-CM | POA: Insufficient documentation

## 2024-03-21 DIAGNOSIS — G609 Hereditary and idiopathic neuropathy, unspecified: Secondary | ICD-10-CM | POA: Insufficient documentation

## 2024-03-21 LAB — CBC WITH DIFFERENTIAL/PLATELET
Abs Immature Granulocytes: 0.04 K/uL (ref 0.00–0.07)
Basophils Absolute: 0 K/uL (ref 0.0–0.1)
Basophils Relative: 1 %
Eosinophils Absolute: 0 K/uL (ref 0.0–0.5)
Eosinophils Relative: 1 %
HCT: 46.4 % (ref 39.0–52.0)
Hemoglobin: 15.7 g/dL (ref 13.0–17.0)
Immature Granulocytes: 1 %
Lymphocytes Relative: 18 %
Lymphs Abs: 1.1 K/uL (ref 0.7–4.0)
MCH: 29.3 pg (ref 26.0–34.0)
MCHC: 33.8 g/dL (ref 30.0–36.0)
MCV: 86.7 fL (ref 80.0–100.0)
Monocytes Absolute: 0.5 K/uL (ref 0.1–1.0)
Monocytes Relative: 8 %
Neutro Abs: 4.3 K/uL (ref 1.7–7.7)
Neutrophils Relative %: 71 %
Platelets: 219 K/uL (ref 150–400)
RBC: 5.35 MIL/uL (ref 4.22–5.81)
RDW: 12.9 % (ref 11.5–15.5)
WBC: 6 K/uL (ref 4.0–10.5)
nRBC: 0 % (ref 0.0–0.2)

## 2024-03-21 LAB — URINALYSIS, ROUTINE W REFLEX MICROSCOPIC
Bacteria, UA: NONE SEEN
Bilirubin Urine: NEGATIVE
Glucose, UA: >=500 mg/dL — AB
Ketones, ur: NEGATIVE mg/dL
Leukocytes,Ua: NEGATIVE
Nitrite: NEGATIVE
Protein, ur: 100 mg/dL — AB
Specific Gravity, Urine: 1.03 (ref 1.005–1.030)
pH: 6 (ref 5.0–8.0)

## 2024-03-21 LAB — COMPREHENSIVE METABOLIC PANEL WITH GFR
ALT: 18 U/L (ref 0–44)
AST: 19 U/L (ref 15–41)
Albumin: 4.3 g/dL (ref 3.5–5.0)
Alkaline Phosphatase: 188 U/L — ABNORMAL HIGH (ref 38–126)
Anion gap: 14 (ref 5–15)
BUN: 10 mg/dL (ref 6–20)
CO2: 27 mmol/L (ref 22–32)
Calcium: 9.9 mg/dL (ref 8.9–10.3)
Chloride: 95 mmol/L — ABNORMAL LOW (ref 98–111)
Creatinine, Ser: 0.95 mg/dL (ref 0.61–1.24)
GFR, Estimated: >60 mL/min
Glucose, Bld: 385 mg/dL — ABNORMAL HIGH (ref 70–99)
Potassium: 3.4 mmol/L — ABNORMAL LOW (ref 3.5–5.1)
Sodium: 136 mmol/L (ref 135–145)
Total Bilirubin: 0.5 mg/dL (ref 0.0–1.2)
Total Protein: 7.4 g/dL (ref 6.5–8.1)

## 2024-03-21 LAB — TROPONIN T, HIGH SENSITIVITY
Troponin T High Sensitivity: 21 ng/L — ABNORMAL HIGH (ref 0–19)
Troponin T High Sensitivity: 21 ng/L — ABNORMAL HIGH (ref 0–19)

## 2024-03-21 LAB — BLOOD GAS, VENOUS
Acid-Base Excess: 6.6 mmol/L — ABNORMAL HIGH (ref 0.0–2.0)
Bicarbonate: 30.5 mmol/L — ABNORMAL HIGH (ref 20.0–28.0)
O2 Saturation: 75.8 %
Patient temperature: 37.2
pCO2, Ven: 40 mmHg — ABNORMAL LOW (ref 44–60)
pH, Ven: 7.49 — ABNORMAL HIGH (ref 7.25–7.43)
pO2, Ven: 40 mmHg (ref 32–45)

## 2024-03-21 LAB — LIPASE, BLOOD: Lipase: 18 U/L (ref 11–51)

## 2024-03-21 LAB — CBG MONITORING, ED: Glucose-Capillary: 455 mg/dL — ABNORMAL HIGH (ref 70–99)

## 2024-03-21 MED ORDER — CLOTRIMAZOLE 1 % EX CREA: TOPICAL_CREAM | CUTANEOUS | 0 refills | Status: AC

## 2024-03-21 MED ORDER — INSULIN ASPART FLEXPEN 100 UNIT/ML ~~LOC~~ SOPN: 8.0000 [IU] | PEN_INJECTOR | Freq: Three times a day (TID) | SUBCUTANEOUS | 3 refills | Status: DC

## 2024-03-21 MED ORDER — POVIDONE-IODINE 10 % EX SOLN: 1.0000 | CUTANEOUS | 0 refills | Status: AC | PRN

## 2024-03-21 MED ORDER — SODIUM CHLORIDE 0.9 % IV BOLUS
1000.0000 mL | Freq: Once | INTRAVENOUS | Status: AC
  Administered 2024-03-21: 1000 mL via INTRAVENOUS

## 2024-03-21 MED ORDER — GABAPENTIN 300 MG PO CAPS
300.0000 mg | ORAL_CAPSULE | Freq: Once | ORAL | Status: AC
  Administered 2024-03-21: 300 mg via ORAL
  Filled 2024-03-21: qty 1

## 2024-03-21 MED ORDER — HYDROCORTISONE 1 % EX CREA: TOPICAL_CREAM | CUTANEOUS | 0 refills | Status: AC

## 2024-03-21 MED ORDER — FREESTYLE SYSTEM KIT: 1.0000 | PACK | 0 refills | Status: AC | PRN

## 2024-03-21 MED ORDER — TRESIBA FLEXTOUCH 100 UNIT/ML ~~LOC~~ SOPN: 8.0000 [IU] | PEN_INJECTOR | Freq: Every day | SUBCUTANEOUS | 0 refills | Status: DC

## 2024-03-21 MED ORDER — GABAPENTIN 300 MG PO CAPS: 600.0000 mg | ORAL_CAPSULE | Freq: Three times a day (TID) | ORAL | 0 refills | Status: DC

## 2024-03-21 NOTE — ED Triage Notes (Signed)
 Pt comes in feet pain, hyperglycemia, left rib pain, n/v, throwing up blood, diarrhea.  Pt has had his gallbladder removed.     EMS VS 168/104 BP 455 BG  20G L AC

## 2024-03-21 NOTE — Discharge Instructions (Signed)
 Please restart your diabetic medications and follow-up with your endocrinologist.

## 2024-03-21 NOTE — ED Provider Notes (Signed)
 " [place] EMERGENCY DEPARTMENT AT [facility] Provider Note   CSN: [id number] Arrival date & time: 03/21/24  8151     Patient presents with: Hyperglycemia   Jordan Osborn is a 54 y.o. male.  He is here with multiple complaints.  He said he is having burning pain in his feet that have been going on for a while.  Skin is dry.  He also has been coughing up some dark blood or vomiting, patient is unclear.  Pain in his left side of his abdomen.  Blood sugars are elevated.  Has not taken his diabetic medication for over a month.  {Add pertinent medical, surgical, social history, OB history to YEP:67052} The history is provided by the patient.  Hyperglycemia Blood sugar level PTA:  400s Severity:  Unable to specify Onset quality:  Unable to specify Progression:  Unchanged Current diabetic therapy:  None Context: noncompliance   Relieved by:  None tried Associated symptoms: abdominal pain, chest pain and vomiting   Associated symptoms: no dysuria and no fever        Prior to Admission medications  Medication Sig Start Date End Date Taking? Authorizing Provider  amLODipine  (NORVASC ) 5 MG tablet Take 1 tablet (5 mg total) by mouth daily. Patient not taking: Reported on 09/12/2023 03/06/21   Evonnie Lenis, MD  aspirin  81 MG chewable tablet Chew 162 mg by mouth daily. Patient not taking: Reported on 09/12/2023    [provider]  atorvastatin  (LIPITOR) 40 MG tablet Take 40 mg by mouth daily. 09/29/21   [provider]  azelastine (ASTELIN) 0.1 % nasal spray Place into both nostrils. Patient not taking: Reported on 10/20/2021 09/18/21   [provider]  benzonatate  (TESSALON ) 100 MG capsule Take 1 capsule (100 mg total) by mouth every 8 (eight) hours. Patient not taking: Reported on 09/12/2023 03/02/23   Yolande Lamar BROCKS, MD  carvedilol (COREG) 3.125 MG tablet Take 3.125 mg by mouth 2 (two) times daily. 09/29/21   [provider]  DULoxetine (CYMBALTA) 30  MG capsule Take 60 mg by mouth daily. 11/02/21   [provider]  gabapentin  (NEURONTIN ) 300 MG capsule Take 300 mg by mouth 2 (two) times daily. Patient taking differently: Take 600 mg by mouth 3 (three) times daily. 04/07/20   [provider]  glucose monitoring kit (FREESTYLE) monitoring kit 1 each by Does not apply route as needed for other. Patient not taking: Reported on 09/12/2023 10/09/19   Carita Senior, MD  Insulin  Aspart FlexPen (NOVOLOG ) 100 UNIT/ML Inject 8 Units into the skin 3 (three) times daily with meals. 09/12/23   Therisa Benton PARAS, NP  insulin  degludec (TRESIBA  FLEXTOUCH) 100 UNIT/ML FlexTouch Pen Inject 30 Units into the skin at bedtime. Patient taking differently: Inject 8-15 Units into the skin at bedtime. 09/12/23   Therisa Benton PARAS, NP  labetalol  (NORMODYNE ) 100 MG tablet Take by mouth daily. Patient not taking: Reported on 10/20/2021 08/23/21   [provider]  lisinopril (ZESTRIL) 20 MG tablet Take 20 mg by mouth daily. 05/09/23   [provider]  losartan  (COZAAR ) 50 MG tablet Take 1 tablet (50 mg total) by mouth daily. Patient not taking: Reported on 09/12/2023 03/02/23   Yolande Lamar BROCKS, MD  naproxen  (NAPROSYN ) 500 MG tablet Take 1 tablet (500 mg total) by mouth 2 (two) times daily. 02/17/24   Neysa Thersia RAMAN, PA-C  nitroGLYCERIN (NITROSTAT) 0.4 MG SL tablet Place 0.4 mg under the tongue every 5 (five) minutes as  needed for chest pain. 10/19/20   [provider]  pantoprazole  (PROTONIX ) 40 MG tablet Take 1 tablet (40 mg total) by mouth 2 (two) times daily. 03/06/21   Evonnie Lenis, MD  pregabalin (LYRICA) 100 MG capsule Take 100 mg by mouth 2 (two) times daily. Patient not taking: Reported on 09/12/2023 09/29/21   [provider]  traMADol (ULTRAM) 50 MG tablet Take 50 mg by mouth every 4 (four) hours as needed. Patient not taking: Reported on 09/12/2023 09/14/21   [provider]    Allergies: Benadryl  [diphenhydramine hcl], Morphine  and codeine, and Omnipaque  [iohexol ]    Review of Systems  Constitutional:  Negative for fever.  Cardiovascular:  Positive for chest pain.  Gastrointestinal:  Positive for abdominal pain and vomiting.  Genitourinary:  Negative for dysuria.    Updated Vital Signs There were no vitals taken for this visit.  Physical Exam Vitals and nursing note reviewed.  Constitutional:      General: He is not in acute distress.    Appearance: Normal appearance. He is well-developed.  HENT:     Head: Normocephalic and atraumatic.  Eyes:     Conjunctiva/sclera: Conjunctivae normal.  Cardiovascular:     Rate and Rhythm: Normal rate and regular rhythm.     Heart sounds: No murmur heard. Pulmonary:     Effort: Pulmonary effort is normal. No respiratory distress.     Breath sounds: Normal breath sounds.  Abdominal:     Palpations: Abdomen is soft.     Tenderness: There is no abdominal tenderness. There is no guarding or rebound.  Musculoskeletal:        General: No deformity.     Cervical back: Neck supple.  Skin:    General: Skin is warm and dry.     Capillary Refill: Capillary refill takes less than 2 seconds.  Neurological:     General: No focal deficit present.     Mental Status: He is alert.     (all labs ordered are listed, but only abnormal results are displayed) Labs Reviewed  CBG MONITORING, ED - Abnormal; Notable for the following components:      Result Value   Glucose-Capillary 455 (*)    All other components within normal limits  CBC WITH DIFFERENTIAL/PLATELET  BLOOD GAS, VENOUS  BETA-HYDROXYBUTYRIC ACID  LIPASE, BLOOD  URINALYSIS, ROUTINE W REFLEX MICROSCOPIC  COMPREHENSIVE METABOLIC PANEL WITH GFR  TROPONIN T, HIGH SENSITIVITY    EKG: None  Radiology: No results found.  {Document cardiac monitor, telemetry assessment procedure when appropriate:32947} Procedures   Medications Ordered in the ED  sodium chloride  0.9 % bolus 1,000  mL (has no administration in time range)  gabapentin  (NEURONTIN ) capsule 300 mg (has no administration in time range)      {Click here for ABCD2, HEART and other calculators REFRESH Note before signing:1}                              Medical Decision Making Amount and/or Complexity of Data Reviewed Labs: ordered. Radiology: ordered.  Risk Prescription drug management.   This patient complains of ***; this involves an extensive number of treatment Options and is a complaint that carries with it a high risk of complications and morbidity. The differential includes ***  I ordered, reviewed and interpreted labs, which included *** I ordered medication *** and reviewed PMP when indicated. I ordered imaging studies which included *** and I independently  visualized and interpreted imaging which showed *** Additional history obtained from *** Previous records obtained and reviewed *** I consulted *** and discussed lab and imaging findings and discussed disposition.  Cardiac monitoring reviewed, *** Social determinants considered, *** Critical Interventions: ***  After the interventions stated above, I reevaluated the patient and found *** Admission and further testing considered, ***   {Document critical care time when appropriate  Document review of labs and clinical decision tools ie CHADS2VASC2, etc  Document your independent review of radiology images and any outside records  Document your discussion with family members, caretakers and with consultants  Document social determinants of health affecting pt's care  Document your decision making why or why not admission, treatments were needed:32947:::1}   Final diagnoses:  None    ED Discharge Orders     None        "

## 2024-03-22 ENCOUNTER — Emergency Department (HOSPITAL_COMMUNITY): Admission: EM | Admit: 2024-03-22 | Discharge: 2024-03-22 | Disposition: A | Discharge status: Home / Self Care

## 2024-03-22 DIAGNOSIS — Z7982 Long term (current) use of aspirin: Secondary | ICD-10-CM | POA: Insufficient documentation

## 2024-03-22 DIAGNOSIS — R739 Hyperglycemia, unspecified: Secondary | ICD-10-CM | POA: Insufficient documentation

## 2024-03-22 DIAGNOSIS — R000 Tachycardia, unspecified: Secondary | ICD-10-CM | POA: Insufficient documentation

## 2024-03-22 DIAGNOSIS — Z5900 Homelessness unspecified: Secondary | ICD-10-CM | POA: Diagnosis not present

## 2024-03-22 DIAGNOSIS — R5383 Other fatigue: Secondary | ICD-10-CM | POA: Diagnosis not present

## 2024-03-22 DIAGNOSIS — Z79899 Other long term (current) drug therapy: Secondary | ICD-10-CM | POA: Diagnosis not present

## 2024-03-22 DIAGNOSIS — Z794 Long term (current) use of insulin: Secondary | ICD-10-CM | POA: Diagnosis not present

## 2024-03-22 LAB — CBC WITH DIFFERENTIAL/PLATELET
Abs Immature Granulocytes: 0.03 K/uL (ref 0.00–0.07)
Basophils Absolute: 0 K/uL (ref 0.0–0.1)
Basophils Relative: 0 %
Eosinophils Absolute: 0 K/uL (ref 0.0–0.5)
Eosinophils Relative: 0 %
HCT: 42.6 % (ref 39.0–52.0)
Hemoglobin: 14.4 g/dL (ref 13.0–17.0)
Immature Granulocytes: 1 %
Lymphocytes Relative: 18 %
Lymphs Abs: 1.2 K/uL (ref 0.7–4.0)
MCH: 29.8 pg (ref 26.0–34.0)
MCHC: 33.8 g/dL (ref 30.0–36.0)
MCV: 88 fL (ref 80.0–100.0)
Monocytes Absolute: 0.5 K/uL (ref 0.1–1.0)
Monocytes Relative: 8 %
Neutro Abs: 4.6 K/uL (ref 1.7–7.7)
Neutrophils Relative %: 73 %
Platelets: 218 K/uL (ref 150–400)
RBC: 4.84 MIL/uL (ref 4.22–5.81)
RDW: 12.8 % (ref 11.5–15.5)
WBC: 6.3 K/uL (ref 4.0–10.5)
nRBC: 0 % (ref 0.0–0.2)

## 2024-03-22 LAB — BETA-HYDROXYBUTYRIC ACID
Beta-Hydroxybutyric Acid: 0.37 mmol/L — ABNORMAL HIGH (ref 0.05–0.27)
Beta-Hydroxybutyric Acid: 0.13 mmol/L (ref 0.05–0.27)

## 2024-03-22 LAB — URINALYSIS, ROUTINE W REFLEX MICROSCOPIC
Bacteria, UA: NONE SEEN
Bilirubin Urine: NEGATIVE
Glucose, UA: >=500 mg/dL — AB
Ketones, ur: NEGATIVE mg/dL
Leukocytes,Ua: NEGATIVE
Nitrite: NEGATIVE
Protein, ur: NEGATIVE mg/dL
Specific Gravity, Urine: 1.029 (ref 1.005–1.030)
pH: 5 (ref 5.0–8.0)

## 2024-03-22 LAB — BASIC METABOLIC PANEL WITH GFR
Anion gap: 18 — ABNORMAL HIGH (ref 5–15)
BUN: 14 mg/dL (ref 6–20)
CO2: 23 mmol/L (ref 22–32)
Calcium: 9.6 mg/dL (ref 8.9–10.3)
Chloride: 93 mmol/L — ABNORMAL LOW (ref 98–111)
Creatinine, Ser: 1.14 mg/dL (ref 0.61–1.24)
GFR, Estimated: >60 mL/min
Glucose, Bld: 660 mg/dL (ref 70–99)
Potassium: 4.1 mmol/L (ref 3.5–5.1)
Sodium: 134 mmol/L — ABNORMAL LOW (ref 135–145)

## 2024-03-22 LAB — BLOOD GAS, VENOUS
Acid-Base Excess: 9.2 mmol/L — ABNORMAL HIGH (ref 0.0–2.0)
Bicarbonate: 34.8 mmol/L — ABNORMAL HIGH (ref 20.0–28.0)
Drawn by: 4237
O2 Saturation: 81 %
Patient temperature: 36.7
pCO2, Ven: 49 mmHg (ref 44–60)
pH, Ven: 7.46 — ABNORMAL HIGH (ref 7.25–7.43)
pO2, Ven: 43 mmHg (ref 32–45)

## 2024-03-22 LAB — CBG MONITORING, ED
Glucose-Capillary: >600 mg/dL (ref 70–99)
Glucose-Capillary: 336 mg/dL — ABNORMAL HIGH (ref 70–99)

## 2024-03-22 MED ORDER — LACTATED RINGERS IV BOLUS
1000.0000 mL | Freq: Once | INTRAVENOUS | Status: AC
  Administered 2024-03-22: 1000 mL via INTRAVENOUS

## 2024-03-22 MED ORDER — INSULIN ASPART 100 UNIT/ML IJ SOLN
10.0000 [IU] | Freq: Once | INTRAMUSCULAR | Status: AC
  Administered 2024-03-22: 10 [IU] via SUBCUTANEOUS
  Filled 2024-03-22: qty 1

## 2024-03-22 NOTE — ED Provider Notes (Signed)
 Signout from Dr. Gennaro.  54 year old male with history of diabetes, medication noncompliance here with elevated blood sugars.  I actually saw the patient yesterday for similar presentation.  His main complaint is the pain in his feet from his neuropathy.  Lab work showing elevated blood sugar with normal gap.  No evidence of acidosis.  No ketones in urine.  He is getting IV fluids and some subcu insulin .  He had prescriptions given to him yesterday to restart his diabetic medications.  Plan is to reassess after fluids and medication for likely discharge. Physical Exam  BP (!) 174/90 (BP Location: Left Arm)   Pulse (!) 111   Temp 98 F (36.7 C) (Oral)   Resp 16   SpO2 92%   Physical Exam  Procedures  Procedures  ED Course / MDM    Medical Decision Making Amount and/or Complexity of Data Reviewed Labs: ordered.  Risk Prescription drug management.   Lab work most significantly abnormal for elevated blood sugar.  Minimal gap normal pH normal beta hydroxybutyrate.  Received fluids and subcu insulin  with improvement in blood sugar.  Patient had received prescriptions to restart his diabetic medication yesterday.  Instructed to fill them and restart his medication.  Return instructions discussed.       Towana Ozell BROCKS, MD 03/23/24 [phone]

## 2024-03-22 NOTE — ED Notes (Signed)
 Discharge instructions reviewed with patient. Patient questions answered and opportunity for education reviewed. Patient voices understanding of discharge instructions with no further questions. Patient ambulatory with steady gait to lobby.

## 2024-03-22 NOTE — Discharge Instructions (Addendum)
 please restart your diabetic medications.  Follow-up with your endocrinologist.
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
## 2024-03-22 NOTE — ED Notes (Addendum)
 This RN called into pt room to assist pt get up. Pt states that he dropped his change on the floor and sat down on the floor to pick it up. Pt did not fall. Did not hit head, did not lose consciousness. Assisted patient back into bed. Bed rails raised and patient instructed to ring the call bell if he needs to pick something up off the floor. Call bell at bedside. Pt voices understanding.

## 2024-03-22 NOTE — ED Provider Notes (Signed)
 " [place] EMERGENCY DEPARTMENT AT [facility] Provider Note   CSN: [id number] Arrival date & time: 03/22/24  1419     Patient presents with: Hyperglycemia   Jordan Osborn is a 54 y.o. male.   54 year old male presents for evaluation of hyperglycemia.  Per patient he was at [place], and left.  Sugar was 300 there so he called 911.  He drank a fountain soda in between the time he called and came to the hospital now his sugar is 600.  He states he has been compliant with his medications.  Was seen in the ER yesterday for similar symptoms.  He is a very poor historian and not forthcoming with history.  Denies any other symptoms or concerns.   Hyperglycemia Associated symptoms: fatigue   Associated symptoms: no abdominal pain, no chest pain, no dysuria, no fever, no shortness of breath and no vomiting        Prior to Admission medications  Medication Sig Start Date End Date Taking? Authorizing Provider  amLODipine  (NORVASC ) 5 MG tablet Take 1 tablet (5 mg total) by mouth daily. Patient not taking: Reported on 09/12/2023 03/06/21   Evonnie Lenis, MD  aspirin  81 MG chewable tablet Chew 162 mg by mouth daily. Patient not taking: Reported on 09/12/2023    [provider]  atorvastatin  (LIPITOR) 40 MG tablet Take 40 mg by mouth daily. 09/29/21   [provider]  azelastine (ASTELIN) 0.1 % nasal spray Place into both nostrils. Patient not taking: Reported on 10/20/2021 09/18/21   [provider]  benzonatate  (TESSALON ) 100 MG capsule Take 1 capsule (100 mg total) by mouth every 8 (eight) hours. Patient not taking: Reported on 09/12/2023 03/02/23   Yolande Lamar BROCKS, MD  carvedilol (COREG) 3.125 MG tablet Take 3.125 mg by mouth 2 (two) times daily. 09/29/21   [provider]  clotrimazole  (LOTRIMIN ) 1 % cream Apply to affected area 2 times daily 03/21/24   Butler, Michael C, MD  DULoxetine (CYMBALTA) 30 MG capsule Take 60 mg by mouth daily. 11/02/21   [provider]  gabapentin  (NEURONTIN ) 300 MG capsule Take 2 capsules (600 mg total) by mouth 3 (three) times daily. 03/21/24   Towana Ozell BROCKS, MD  glucose monitoring kit (FREESTYLE) monitoring kit 1 each by Does not apply route as needed for other. 03/21/24   Towana Ozell BROCKS, MD  hydrocortisone  cream 1 % Apply to affected area 2 times daily 03/21/24   Towana Ozell BROCKS, MD  Insulin  Aspart FlexPen (NOVOLOG ) 100 UNIT/ML Inject 8 Units into the skin 3 (three) times daily with meals. 03/21/24   Towana Ozell BROCKS, MD  insulin  degludec (TRESIBA  FLEXTOUCH) 100 UNIT/ML FlexTouch Pen Inject 8-15 Units into the skin at bedtime. 03/21/24   Towana Ozell BROCKS, MD  labetalol  (NORMODYNE ) 100 MG tablet Take by mouth daily. Patient not taking: Reported on 10/20/2021 08/23/21   [provider]  lisinopril (ZESTRIL) 20 MG tablet Take 20 mg by mouth daily. 05/09/23   [provider]  losartan  (COZAAR ) 50 MG tablet Take 1 tablet (50 mg total) by mouth daily. Patient not taking: Reported on 09/12/2023 03/02/23   Yolande Lamar BROCKS, MD  naproxen  (NAPROSYN ) 500 MG tablet Take 1 tablet (500 mg total) by mouth 2 (two) times daily. 02/17/24   Neysa Thersia RAMAN, PA-C  nitroGLYCERIN (NITROSTAT) 0.4 MG SL tablet Place 0.4 mg under the tongue every 5 (five) minutes as needed for chest pain. 10/19/20   [provider]  pantoprazole  (PROTONIX ) 40 MG tablet Take 1 tablet (40 mg total) by mouth 2 (two) times daily. 03/06/21   Evonnie Lenis, MD  povidone-iodine  (BETADINE ) 10 % external solution Apply 1 Application topically as needed for wound care. 03/21/24   Butler, Michael C, MD  pregabalin (LYRICA) 100 MG capsule Take 100 mg by mouth 2 (two) times daily. Patient not taking: Reported on 09/12/2023 09/29/21   [provider]  traMADol (ULTRAM) 50 MG tablet Take 50 mg by mouth every 4 (four) hours as needed. Patient not taking: Reported on 09/12/2023 09/14/21   [provider]    Allergies: Benadryl  [diphenhydramine hcl], Morphine  and codeine, and Omnipaque  [iohexol ]    Review of Systems  Constitutional:  Positive for fatigue. Negative for chills and fever.  HENT:  Negative for ear pain and sore throat.   Eyes:  Negative for pain and visual disturbance.  Respiratory:  Negative for cough and shortness of breath.   Cardiovascular:  Negative for chest pain and palpitations.  Gastrointestinal:  Negative for abdominal pain and vomiting.  Genitourinary:  Negative for dysuria and hematuria.  Musculoskeletal:  Negative for arthralgias and back pain.  Skin:  Negative for color change and rash.  Neurological:  Negative for seizures and syncope.  All other systems reviewed and are negative.   Updated Vital Signs BP (!) 174/90 (BP Location: Left Arm)   Pulse (!) 111   Temp 98 F (36.7 C) (Oral)   Resp 16   SpO2 92%   Physical Exam Vitals and nursing note reviewed.  Constitutional:      General: He is not in acute distress.    Appearance: Normal appearance. He is well-developed. He is not ill-appearing.  HENT:     Head: Normocephalic and atraumatic.  Eyes:     Conjunctiva/sclera: Conjunctivae normal.  Cardiovascular:     Rate and Rhythm: Normal rate and regular rhythm.     Heart sounds: No murmur heard. Pulmonary:     Effort: Pulmonary effort is normal. No respiratory distress.     Breath sounds: Normal breath sounds.  Abdominal:     Palpations: Abdomen is soft.     Tenderness: There is no abdominal tenderness.  Musculoskeletal:        General: No swelling.     Cervical back: Neck supple.  Skin:    General: Skin is warm and dry.     Capillary Refill: Capillary refill takes less than 2 seconds.  Neurological:     General: No focal deficit present.     Mental Status: He is alert.  Psychiatric:        Mood and Affect: Mood normal.     (all labs ordered are listed, but only abnormal results are displayed) Labs Reviewed  URINALYSIS, ROUTINE W REFLEX MICROSCOPIC -  Abnormal; Notable for the following components:      Result Value   Color, Urine COLORLESS (*)    Glucose, UA >=500 (*)    Hgb urine dipstick SMALL (*)    All other components within normal limits  BLOOD GAS, VENOUS - Abnormal; Notable for the following components:   pH, Ven 7.46 (*)    Bicarbonate 34.8 (*)    Acid-Base Excess 9.2 (*)    All other components within normal limits  CBG MONITORING, ED - Abnormal; Notable for the following components:   Glucose-Capillary >600 (*)    All other components within normal limits  CBC WITH DIFFERENTIAL/PLATELET  BASIC METABOLIC PANEL WITH GFR  BETA-HYDROXYBUTYRIC ACID  EKG: EKG Interpretation Date/Time:  Sunday March 22 2024 15:33:31 EST Ventricular Rate:  102 PR Interval:  155 QRS Duration:  99 QT Interval:  350 QTC Calculation: 456 R Axis:   87  Text Interpretation: Sinus tachycardia Probable left ventricular hypertrophy Nonspecific T abnormalities, inferior leads Nonspecific ST changes Compared with prior EKG from 03/21/2024 Confirmed by Gennaro Bouchard ([id number]) on 03/22/2024 3:38:09 PM  Radiology: [facility] Chest Port 1 View Result Date: 03/21/2024 EXAM: 1 VIEW(S) XRAY OF THE CHEST 03/21/2024 08:16:42 PM COMPARISON: None available. CLINICAL HISTORY: cough FINDINGS: LUNGS AND PLEURA: There is mild elevation of the left hemidiaphragm. No focal pulmonary opacity. No pleural effusion. No pneumothorax. HEART AND MEDIASTINUM: No acute abnormality of the cardiac and mediastinal silhouettes. BONES AND SOFT TISSUES: No acute osseous abnormality. IMPRESSION: 1. No acute cardiopulmonary findings. Electronically signed by: Greig Pique MD 03/21/2024 08:21 PM EST RP Workstation: [id number]     Procedures   Medications Ordered in the ED  insulin  aspart (novoLOG ) injection 10 Units (has no administration in time range)  lactated ringers  bolus 1,000 mL (1,000 mLs Intravenous New Bag/Given 03/22/24 1454)                                    Medical  Decision Making Cardiac monitor interpretation: sinus tachycardia, no ectopy   Social determinants of health: homelessness, medication non compliance   Patient here for hyperglycemia.  Has a history of medication noncompliance.  Glucose was 600.  He otherwise appears stable has no complaints.  Was here yesterday for similar.  Given a liter of IV fluids.  BMP is pending but remainder of labs are unremarkable.  Patient signed out to oncoming provider at 3:30 PM pending remainder of workup and ultimate disposition.   Amount and/or Complexity of Data Reviewed External Data Reviewed: notes.    Details: Prior ED records reviewed and patient seen her yesterday for the same  Labs: ordered. Decision-making details documented in ED Course.    Details: Ordered And reviewed by me, patient is hyperglycemic, labs fairly unremarkable except BMP is pending ECG/medicine tests: ordered and independent interpretation performed. Decision-making details documented in ED Course.    Details: Ordered and interpreted by me in the absence of cardiology and shows sinus rhythm, no STEMI, or significant change when compared to prior EKG  Risk OTC drugs. Prescription drug management. Drug therapy requiring intensive monitoring for toxicity. Diagnosis or treatment significantly limited by social determinants of health.     Final diagnoses:  Hyperglycemia    ED Discharge Orders     None          Gennaro Bouchard CROME, DO 03/22/24 1552  "

## 2024-03-25 ENCOUNTER — Encounter (HOSPITAL_COMMUNITY): Payer: Self-pay | Admitting: *Deleted

## 2024-03-25 ENCOUNTER — Other Ambulatory Visit: Payer: Self-pay

## 2024-03-25 ENCOUNTER — Inpatient Hospital Stay (HOSPITAL_COMMUNITY)
Admission: EM | Admit: 2024-03-25 | Discharge: 2024-03-31 | DRG: 064 | Disposition: A | Discharge status: Skilled Nursing Facility | Attending: Family Medicine | Admitting: Family Medicine

## 2024-03-25 ENCOUNTER — Emergency Department (HOSPITAL_COMMUNITY)

## 2024-03-25 DIAGNOSIS — M25471 Effusion, right ankle: Secondary | ICD-10-CM | POA: Diagnosis present

## 2024-03-25 DIAGNOSIS — G9341 Metabolic encephalopathy: Secondary | ICD-10-CM | POA: Diagnosis present

## 2024-03-25 DIAGNOSIS — M25472 Effusion, left ankle: Secondary | ICD-10-CM | POA: Diagnosis present

## 2024-03-25 DIAGNOSIS — Z9049 Acquired absence of other specified parts of digestive tract: Secondary | ICD-10-CM

## 2024-03-25 DIAGNOSIS — I10 Essential (primary) hypertension: Secondary | ICD-10-CM | POA: Diagnosis not present

## 2024-03-25 DIAGNOSIS — Z751 Person awaiting admission to adequate facility elsewhere: Secondary | ICD-10-CM

## 2024-03-25 DIAGNOSIS — E1140 Type 2 diabetes mellitus with diabetic neuropathy, unspecified: Secondary | ICD-10-CM | POA: Diagnosis present

## 2024-03-25 DIAGNOSIS — K219 Gastro-esophageal reflux disease without esophagitis: Secondary | ICD-10-CM | POA: Diagnosis present

## 2024-03-25 DIAGNOSIS — G8929 Other chronic pain: Secondary | ICD-10-CM | POA: Diagnosis present

## 2024-03-25 DIAGNOSIS — R29700 NIHSS score 0: Secondary | ICD-10-CM | POA: Diagnosis present

## 2024-03-25 DIAGNOSIS — I639 Cerebral infarction, unspecified: Principal | ICD-10-CM | POA: Diagnosis present

## 2024-03-25 DIAGNOSIS — Z91041 Radiographic dye allergy status: Secondary | ICD-10-CM

## 2024-03-25 DIAGNOSIS — Z794 Long term (current) use of insulin: Secondary | ICD-10-CM

## 2024-03-25 DIAGNOSIS — Z885 Allergy status to narcotic agent status: Secondary | ICD-10-CM

## 2024-03-25 DIAGNOSIS — R7401 Elevation of levels of liver transaminase levels: Secondary | ICD-10-CM | POA: Diagnosis not present

## 2024-03-25 DIAGNOSIS — E1165 Type 2 diabetes mellitus with hyperglycemia: Secondary | ICD-10-CM | POA: Diagnosis present

## 2024-03-25 DIAGNOSIS — R7989 Other specified abnormal findings of blood chemistry: Secondary | ICD-10-CM | POA: Diagnosis present

## 2024-03-25 DIAGNOSIS — Z888 Allergy status to other drugs, medicaments and biological substances status: Secondary | ICD-10-CM

## 2024-03-25 DIAGNOSIS — R739 Hyperglycemia, unspecified: Principal | ICD-10-CM

## 2024-03-25 DIAGNOSIS — Z79899 Other long term (current) drug therapy: Secondary | ICD-10-CM

## 2024-03-25 DIAGNOSIS — Z91148 Patient's other noncompliance with medication regimen for other reason: Secondary | ICD-10-CM

## 2024-03-25 DIAGNOSIS — E878 Other disorders of electrolyte and fluid balance, not elsewhere classified: Secondary | ICD-10-CM

## 2024-03-25 DIAGNOSIS — I6389 Other cerebral infarction: Principal | ICD-10-CM | POA: Diagnosis present

## 2024-03-25 LAB — CBC WITH DIFFERENTIAL/PLATELET
Abs Immature Granulocytes: 0.11 K/uL — ABNORMAL HIGH (ref 0.00–0.07)
Basophils Absolute: 0 K/uL (ref 0.0–0.1)
Basophils Relative: 0 %
Eosinophils Absolute: 0 K/uL (ref 0.0–0.5)
Eosinophils Relative: 0 %
HCT: 51.9 % (ref 39.0–52.0)
Hemoglobin: 17.4 g/dL — ABNORMAL HIGH (ref 13.0–17.0)
Immature Granulocytes: 1 %
Lymphocytes Relative: 11 %
Lymphs Abs: 1 K/uL (ref 0.7–4.0)
MCH: 29.6 pg (ref 26.0–34.0)
MCHC: 33.5 g/dL (ref 30.0–36.0)
MCV: 88.3 fL (ref 80.0–100.0)
Monocytes Absolute: 0.7 K/uL (ref 0.1–1.0)
Monocytes Relative: 8 %
Neutro Abs: 7.2 K/uL (ref 1.7–7.7)
Neutrophils Relative %: 80 %
Platelets: 260 K/uL (ref 150–400)
RBC: 5.88 MIL/uL — ABNORMAL HIGH (ref 4.22–5.81)
RDW: 12.6 % (ref 11.5–15.5)
WBC: 9.1 K/uL (ref 4.0–10.5)
nRBC: 0 % (ref 0.0–0.2)

## 2024-03-25 LAB — COMPREHENSIVE METABOLIC PANEL WITH GFR
ALT: 55 U/L — ABNORMAL HIGH (ref 0–44)
AST: 97 U/L — ABNORMAL HIGH (ref 15–41)
Albumin: 4.3 g/dL (ref 3.5–5.0)
Alkaline Phosphatase: 209 U/L — ABNORMAL HIGH (ref 38–126)
Anion gap: 23 — ABNORMAL HIGH (ref 5–15)
BUN: 20 mg/dL (ref 6–20)
CO2: 20 mmol/L — ABNORMAL LOW (ref 22–32)
Calcium: 9.5 mg/dL (ref 8.9–10.3)
Chloride: 90 mmol/L — ABNORMAL LOW (ref 98–111)
Creatinine, Ser: 1.2 mg/dL (ref 0.61–1.24)
GFR, Estimated: >60 mL/min
Glucose, Bld: 436 mg/dL — ABNORMAL HIGH (ref 70–99)
Potassium: 4 mmol/L (ref 3.5–5.1)
Sodium: 133 mmol/L — ABNORMAL LOW (ref 135–145)
Total Bilirubin: 0.6 mg/dL (ref 0.0–1.2)
Total Protein: 7.8 g/dL (ref 6.5–8.1)

## 2024-03-25 LAB — URINALYSIS, ROUTINE W REFLEX MICROSCOPIC
Bilirubin Urine: NEGATIVE
Glucose, UA: >=500 mg/dL — AB
Ketones, ur: 20 mg/dL — AB
Leukocytes,Ua: NEGATIVE
Nitrite: NEGATIVE
Protein, ur: >=300 mg/dL — AB
Specific Gravity, Urine: 1.027 (ref 1.005–1.030)
pH: 5 (ref 5.0–8.0)

## 2024-03-25 LAB — GLUCOSE, CAPILLARY: Glucose-Capillary: 204 mg/dL — ABNORMAL HIGH (ref 70–99)

## 2024-03-25 LAB — CBG MONITORING, ED
Glucose-Capillary: 484 mg/dL — ABNORMAL HIGH (ref 70–99)
Glucose-Capillary: 327 mg/dL — ABNORMAL HIGH (ref 70–99)
Glucose-Capillary: 249 mg/dL — ABNORMAL HIGH (ref 70–99)
Glucose-Capillary: 156 mg/dL — ABNORMAL HIGH (ref 70–99)
Glucose-Capillary: 198 mg/dL — ABNORMAL HIGH (ref 70–99)

## 2024-03-25 LAB — BLOOD GAS, VENOUS
Acid-Base Excess: 0.5 mmol/L (ref 0.0–2.0)
Bicarbonate: 29.8 mmol/L — ABNORMAL HIGH (ref 20.0–28.0)
Drawn by: 73776
O2 Saturation: 40.9 %
Patient temperature: 36.4
pCO2, Ven: 63 mmHg — ABNORMAL HIGH (ref 44–60)
pH, Ven: 7.28 (ref 7.25–7.43)
pO2, Ven: <31 mmHg — CL (ref 32–45)

## 2024-03-25 LAB — LIPASE, BLOOD: Lipase: 22 U/L (ref 11–51)

## 2024-03-25 MED ORDER — DEXTROSE 50 % IV SOLN: 0.0000 mL | INTRAVENOUS | Status: DC | PRN

## 2024-03-25 MED ORDER — ONDANSETRON HCL 4 MG/2ML IJ SOLN: 4.0000 mg | Freq: Four times a day (QID) | INTRAMUSCULAR | Status: DC | PRN

## 2024-03-25 MED ORDER — SODIUM CHLORIDE 0.9 % IV BOLUS
1000.0000 mL | Freq: Once | INTRAVENOUS | Status: AC
  Administered 2024-03-25: 1000 mL via INTRAVENOUS

## 2024-03-25 MED ORDER — INSULIN ASPART 100 UNIT/ML IJ SOLN
10.0000 [IU] | Freq: Once | INTRAMUSCULAR | Status: AC
  Administered 2024-03-25: 10 [IU] via SUBCUTANEOUS
  Filled 2024-03-25: qty 1

## 2024-03-25 MED ORDER — DEXTROSE 50 % IV SOLN
0.0000 mL | INTRAVENOUS | Status: DC | PRN
  Administered 2024-03-25: 50 mL via INTRAVENOUS
  Filled 2024-03-25: qty 50

## 2024-03-25 MED ORDER — INSULIN REGULAR(HUMAN) IN NACL 100-0.9 UT/100ML-% IV SOLN
INTRAVENOUS | Status: DC
  Administered 2024-03-25: 10.5 [IU]/h via INTRAVENOUS

## 2024-03-25 MED ORDER — ONDANSETRON HCL 4 MG PO TABS: 4.0000 mg | ORAL_TABLET | Freq: Four times a day (QID) | ORAL | Status: DC | PRN

## 2024-03-25 MED ORDER — ACETAMINOPHEN 650 MG RE SUPP
650.0000 mg | Freq: Four times a day (QID) | RECTAL | Status: DC | PRN
  Administered 2024-03-26: 650 mg via RECTAL
  Filled 2024-03-25 (×2): qty 1

## 2024-03-25 MED ORDER — ENOXAPARIN SODIUM 40 MG/0.4ML IJ SOSY
40.0000 mg | PREFILLED_SYRINGE | INTRAMUSCULAR | Status: DC
  Administered 2024-03-26 – 2024-03-31 (×6): 40 mg via SUBCUTANEOUS
  Filled 2024-03-25 (×6): qty 0.4

## 2024-03-25 MED ORDER — INSULIN REGULAR(HUMAN) IN NACL 100-0.9 UT/100ML-% IV SOLN
INTRAVENOUS | Status: DC
  Filled 2024-03-25: qty 100

## 2024-03-25 MED ORDER — LACTATED RINGERS IV SOLN: INTRAVENOUS | Status: DC

## 2024-03-25 MED ORDER — ACETAMINOPHEN 325 MG PO TABS
650.0000 mg | ORAL_TABLET | Freq: Four times a day (QID) | ORAL | Status: DC | PRN
  Administered 2024-03-26 – 2024-03-31 (×3): 650 mg via ORAL
  Filled 2024-03-25 (×4): qty 2

## 2024-03-25 MED ORDER — INSULIN ASPART 100 UNIT/ML IJ SOLN
0.0000 [IU] | Freq: Every day | INTRAMUSCULAR | Status: DC
  Administered 2024-03-27: 4 [IU] via SUBCUTANEOUS
  Administered 2024-03-28: 2 [IU] via SUBCUTANEOUS
  Administered 2024-03-29: 3 [IU] via SUBCUTANEOUS
  Filled 2024-03-25 (×3): qty 1

## 2024-03-25 MED ORDER — DEXTROSE-SODIUM CHLORIDE 5-0.45 % IV SOLN: INTRAVENOUS | Status: DC

## 2024-03-25 MED ORDER — METOCLOPRAMIDE HCL 5 MG/ML IJ SOLN
10.0000 mg | Freq: Once | INTRAMUSCULAR | Status: AC
  Administered 2024-03-25: 10 mg via INTRAVENOUS
  Filled 2024-03-25: qty 2

## 2024-03-25 MED ORDER — INSULIN ASPART 100 UNIT/ML IJ SOLN
0.0000 [IU] | Freq: Three times a day (TID) | INTRAMUSCULAR | Status: DC
  Administered 2024-03-26: 2 [IU] via SUBCUTANEOUS
  Administered 2024-03-26: 7 [IU] via SUBCUTANEOUS
  Administered 2024-03-26 – 2024-03-27 (×4): 5 [IU] via SUBCUTANEOUS
  Administered 2024-03-28: 7 [IU] via SUBCUTANEOUS
  Administered 2024-03-28: 5 [IU] via SUBCUTANEOUS
  Administered 2024-03-28: 3 [IU] via SUBCUTANEOUS
  Administered 2024-03-29: 5 [IU] via SUBCUTANEOUS
  Administered 2024-03-29: 3 [IU] via SUBCUTANEOUS
  Administered 2024-03-29: 7 [IU] via SUBCUTANEOUS
  Administered 2024-03-30: 5 [IU] via SUBCUTANEOUS
  Filled 2024-03-25 (×13): qty 1

## 2024-03-25 NOTE — H&P (Signed)
 " History and Physical    Patient: Jordan Osborn FMW:[id number] DOB: 1971/01/24 DOA: 03/25/2024 DOS: the patient was seen and examined on 03/25/2024 PCP: Doroteo Deward BIRCH, FNP  Patient coming from: {Point_of_Origin:[address]}  Chief Complaint:  Chief Complaint  Patient presents with   Hyperglycemia   HPI: Jordan Osborn is a 54 y.o. male with medical history significant of ***  Review of Systems: {ROS_Text:26778} Past Medical History:  Diagnosis Date   Diabetes mellitus without complication (HCC)    Hypertension    Past Surgical History:  Procedure Laterality Date   CHOLECYSTECTOMY     HERNIA REPAIR     Social History:  reports that he has never smoked. He has never used smokeless tobacco. He reports that he does not currently use alcohol. He reports that he does not use drugs.  Allergies[1]  History reviewed. No pertinent family history.  Prior to Admission medications  Medication Sig Start Date End Date Taking? Authorizing Provider  amLODipine  (NORVASC ) 5 MG tablet Take 1 tablet (5 mg total) by mouth daily. Patient not taking: Reported on 09/12/2023 03/06/21   Evonnie Lenis, MD  aspirin  81 MG chewable tablet Chew 162 mg by mouth daily. Patient not taking: Reported on 09/12/2023    [provider]  atorvastatin  (LIPITOR) 40 MG tablet Take 40 mg by mouth daily. 09/29/21   [provider]  azelastine (ASTELIN) 0.1 % nasal spray Place into both nostrils. Patient not taking: Reported on 10/20/2021 09/18/21   [provider]  benzonatate  (TESSALON ) 100 MG capsule Take 1 capsule (100 mg total) by mouth every 8 (eight) hours. Patient not taking: Reported on 09/12/2023 03/02/23   Yolande Lamar JAYSON, MD  carvedilol (COREG) 3.125 MG tablet Take 3.125 mg by mouth 2 (two) times daily. 09/29/21   [provider]  clotrimazole  (LOTRIMIN ) 1 % cream Apply to affected area 2 times daily 03/21/24   Butler, Michael C, MD  DULoxetine (CYMBALTA) 30 MG capsule Take 60 mg by mouth daily.  11/02/21   [provider]  gabapentin  (NEURONTIN ) 300 MG capsule Take 2 capsules (600 mg total) by mouth 3 (three) times daily. 03/21/24   Towana Ozell JAYSON, MD  glucose monitoring kit (FREESTYLE) monitoring kit 1 each by Does not apply route as needed for other. 03/21/24   Towana Ozell JAYSON, MD  hydrocortisone  cream 1 % Apply to affected area 2 times daily 03/21/24   Towana Ozell JAYSON, MD  Insulin  Aspart FlexPen (NOVOLOG ) 100 UNIT/ML Inject 8 Units into the skin 3 (three) times daily with meals. 03/21/24   Towana Ozell JAYSON, MD  insulin  degludec (TRESIBA  FLEXTOUCH) 100 UNIT/ML FlexTouch Pen Inject 8-15 Units into the skin at bedtime. 03/21/24   Towana Ozell JAYSON, MD  labetalol  (NORMODYNE ) 100 MG tablet Take by mouth daily. Patient not taking: Reported on 10/20/2021 08/23/21   [provider]  lisinopril (ZESTRIL) 20 MG tablet Take 20 mg by mouth daily. 05/09/23   [provider]  losartan  (COZAAR ) 50 MG tablet Take 1 tablet (50 mg total) by mouth daily. Patient not taking: Reported on 09/12/2023 03/02/23   Yolande Lamar JAYSON, MD  naproxen  (NAPROSYN ) 500 MG tablet Take 1 tablet (500 mg total) by mouth 2 (two) times daily. 02/17/24   Neysa Thersia RAMAN, PA-C  nitroGLYCERIN (NITROSTAT) 0.4 MG SL tablet Place 0.4 mg under the tongue every 5 (five) minutes as needed for chest pain. 10/19/20   [provider]  pantoprazole  (PROTONIX ) 40 MG tablet Take 1 tablet (40 mg  total) by mouth 2 (two) times daily. 03/06/21   Evonnie Lenis, MD  povidone-iodine  (BETADINE ) 10 % external solution Apply 1 Application topically as needed for wound care. 03/21/24   Butler, Michael C, MD  pregabalin (LYRICA) 100 MG capsule Take 100 mg by mouth 2 (two) times daily. Patient not taking: Reported on 09/12/2023 09/29/21   [provider]  traMADol (ULTRAM) 50 MG tablet Take 50 mg by mouth every 4 (four) hours as needed. Patient not taking: Reported on 09/12/2023 09/14/21   [provider]     Physical Exam: Vitals:   03/25/24 1657 03/25/24 1657 03/25/24 1751  BP:  123/73 (!) 138/96  Pulse:  (!) 110 98  Resp:  16 15  Temp:  97.6 F (36.4 C)   SpO2:  97% 95%  Weight: 91.2 kg    Height: 6' (1.829 m)     *** Data Reviewed: {Tip this will not be part of the note when signed- Document your independent interpretation of telemetry tracing, EKG, lab, Radiology test or any other diagnostic tests. Add any new diagnostic test ordered today. (Optional):26781} {Results:26384}  Assessment and Plan: No notes have been filed under this hospital service. Service: Hospitalist     Advance Care Planning:   Code Status: Prior ***  Consults: ***  Family Communication: ***  Severity of Illness: {Observation/Inpatient:21159}  Author: Posey Maier, DO 03/25/2024 9:01 PM  For on call review [email or web address].     [1]  Allergies Allergen Reactions   Benadryl [Diphenhydramine Hcl]    Morphine  And Codeine    Omnipaque  [Iohexol ] Hives and Itching    Patient experienced hive and severe itching whole body after Omnipaque  injection.  Solumedrol and pepsed administered in ED after exam   "

## 2024-03-25 NOTE — ED Notes (Signed)
 Date and time results received: 03/25/24 1817   Test: VBG Critical Value: pO2  <  31  Name of Provider Notified: Garrick SAUNDERS MD
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
## 2024-03-26 ENCOUNTER — Observation Stay (HOSPITAL_COMMUNITY)

## 2024-03-26 ENCOUNTER — Inpatient Hospital Stay (HOSPITAL_COMMUNITY)

## 2024-03-26 ENCOUNTER — Observation Stay (HOSPITAL_COMMUNITY)
Admit: 2024-03-26 | Discharge: 2024-03-26 | Disposition: A | Discharge status: Home / Self Care | Attending: Family Medicine | Admitting: Family Medicine

## 2024-03-26 DIAGNOSIS — K219 Gastro-esophageal reflux disease without esophagitis: Secondary | ICD-10-CM | POA: Diagnosis present

## 2024-03-26 DIAGNOSIS — G8929 Other chronic pain: Secondary | ICD-10-CM | POA: Diagnosis present

## 2024-03-26 DIAGNOSIS — R4182 Altered mental status, unspecified: Secondary | ICD-10-CM

## 2024-03-26 DIAGNOSIS — R569 Unspecified convulsions: Secondary | ICD-10-CM

## 2024-03-26 DIAGNOSIS — Z794 Long term (current) use of insulin: Secondary | ICD-10-CM | POA: Diagnosis not present

## 2024-03-26 DIAGNOSIS — M25471 Effusion, right ankle: Secondary | ICD-10-CM | POA: Diagnosis present

## 2024-03-26 DIAGNOSIS — R739 Hyperglycemia, unspecified: Secondary | ICD-10-CM | POA: Diagnosis present

## 2024-03-26 DIAGNOSIS — R7989 Other specified abnormal findings of blood chemistry: Secondary | ICD-10-CM | POA: Diagnosis present

## 2024-03-26 DIAGNOSIS — Z91041 Radiographic dye allergy status: Secondary | ICD-10-CM | POA: Diagnosis not present

## 2024-03-26 DIAGNOSIS — I6340 Cerebral infarction due to embolism of unspecified cerebral artery: Secondary | ICD-10-CM | POA: Diagnosis not present

## 2024-03-26 DIAGNOSIS — Z79899 Other long term (current) drug therapy: Secondary | ICD-10-CM | POA: Diagnosis not present

## 2024-03-26 DIAGNOSIS — I10 Essential (primary) hypertension: Secondary | ICD-10-CM | POA: Diagnosis present

## 2024-03-26 DIAGNOSIS — G9341 Metabolic encephalopathy: Secondary | ICD-10-CM | POA: Diagnosis present

## 2024-03-26 DIAGNOSIS — M25472 Effusion, left ankle: Secondary | ICD-10-CM | POA: Diagnosis present

## 2024-03-26 DIAGNOSIS — E1165 Type 2 diabetes mellitus with hyperglycemia: Secondary | ICD-10-CM | POA: Diagnosis present

## 2024-03-26 DIAGNOSIS — Z885 Allergy status to narcotic agent status: Secondary | ICD-10-CM | POA: Diagnosis not present

## 2024-03-26 DIAGNOSIS — R29700 NIHSS score 0: Secondary | ICD-10-CM | POA: Diagnosis present

## 2024-03-26 DIAGNOSIS — Z91148 Patient's other noncompliance with medication regimen for other reason: Secondary | ICD-10-CM | POA: Diagnosis not present

## 2024-03-26 DIAGNOSIS — I6389 Other cerebral infarction: Secondary | ICD-10-CM | POA: Diagnosis present

## 2024-03-26 DIAGNOSIS — Z9049 Acquired absence of other specified parts of digestive tract: Secondary | ICD-10-CM | POA: Diagnosis not present

## 2024-03-26 DIAGNOSIS — E1140 Type 2 diabetes mellitus with diabetic neuropathy, unspecified: Secondary | ICD-10-CM | POA: Diagnosis present

## 2024-03-26 DIAGNOSIS — Z888 Allergy status to other drugs, medicaments and biological substances status: Secondary | ICD-10-CM | POA: Diagnosis not present

## 2024-03-26 DIAGNOSIS — Z751 Person awaiting admission to adequate facility elsewhere: Secondary | ICD-10-CM | POA: Diagnosis not present

## 2024-03-26 DIAGNOSIS — I639 Cerebral infarction, unspecified: Secondary | ICD-10-CM | POA: Diagnosis not present

## 2024-03-26 LAB — COMPREHENSIVE METABOLIC PANEL WITH GFR
ALT: 47 U/L — ABNORMAL HIGH (ref 0–44)
AST: 85 U/L — ABNORMAL HIGH (ref 15–41)
Albumin: 3.6 g/dL (ref 3.5–5.0)
Alkaline Phosphatase: 158 U/L — ABNORMAL HIGH (ref 38–126)
Anion gap: 15 (ref 5–15)
BUN: 21 mg/dL — ABNORMAL HIGH (ref 6–20)
CO2: 24 mmol/L (ref 22–32)
Calcium: 8.7 mg/dL — ABNORMAL LOW (ref 8.9–10.3)
Chloride: 95 mmol/L — ABNORMAL LOW (ref 98–111)
Creatinine, Ser: 1.23 mg/dL (ref 0.61–1.24)
GFR, Estimated: >60 mL/min
Glucose, Bld: 330 mg/dL — ABNORMAL HIGH (ref 70–99)
Potassium: 3.6 mmol/L (ref 3.5–5.1)
Sodium: 134 mmol/L — ABNORMAL LOW (ref 135–145)
Total Bilirubin: 0.5 mg/dL (ref 0.0–1.2)
Total Protein: 6.2 g/dL — ABNORMAL LOW (ref 6.5–8.1)

## 2024-03-26 LAB — ETHANOL: Alcohol, Ethyl (B): <15 mg/dL

## 2024-03-26 LAB — GLUCOSE, CAPILLARY
Glucose-Capillary: 344 mg/dL — ABNORMAL HIGH (ref 70–99)
Glucose-Capillary: 317 mg/dL — ABNORMAL HIGH (ref 70–99)
Glucose-Capillary: 293 mg/dL — ABNORMAL HIGH (ref 70–99)
Glucose-Capillary: 318 mg/dL — ABNORMAL HIGH (ref 70–99)
Glucose-Capillary: 237 mg/dL — ABNORMAL HIGH (ref 70–99)
Glucose-Capillary: 186 mg/dL — ABNORMAL HIGH (ref 70–99)
Glucose-Capillary: 180 mg/dL — ABNORMAL HIGH (ref 70–99)

## 2024-03-26 LAB — URINE DRUG SCREEN
Amphetamines: NEGATIVE
Barbiturates: NEGATIVE
Benzodiazepines: NEGATIVE
Cocaine: NEGATIVE
Fentanyl: NEGATIVE
Methadone Scn, Ur: NEGATIVE
Opiates: NEGATIVE
Tetrahydrocannabinol: NEGATIVE

## 2024-03-26 LAB — CBC
HCT: 42.3 % (ref 39.0–52.0)
Hemoglobin: 14.5 g/dL (ref 13.0–17.0)
MCH: 29.7 pg (ref 26.0–34.0)
MCHC: 34.3 g/dL (ref 30.0–36.0)
MCV: 86.7 fL (ref 80.0–100.0)
Platelets: 239 K/uL (ref 150–400)
RBC: 4.88 MIL/uL (ref 4.22–5.81)
RDW: 12.7 % (ref 11.5–15.5)
WBC: 9.8 K/uL (ref 4.0–10.5)
nRBC: 0 % (ref 0.0–0.2)

## 2024-03-26 LAB — HEMOGLOBIN A1C
Hgb A1c MFr Bld: 13.2 % — ABNORMAL HIGH (ref 4.8–5.6)
Mean Plasma Glucose: 332.14 mg/dL

## 2024-03-26 LAB — HIV ANTIBODY (ROUTINE TESTING W REFLEX): HIV Screen 4th Generation wRfx: NONREACTIVE

## 2024-03-26 LAB — MAGNESIUM: Magnesium: 1.8 mg/dL (ref 1.7–2.4)

## 2024-03-26 LAB — PHOSPHORUS: Phosphorus: 2.9 mg/dL (ref 2.5–4.6)

## 2024-03-26 MED ORDER — LACTATED RINGERS IV SOLN
INTRAVENOUS | Status: AC
  Administered 2024-03-26: 125 mL/h via INTRAVENOUS

## 2024-03-26 MED ORDER — INSULIN ASPART 100 UNIT/ML IJ SOLN: 0.0000 [IU] | Freq: Three times a day (TID) | INTRAMUSCULAR | Status: DC

## 2024-03-26 MED ORDER — LABETALOL HCL 5 MG/ML IV SOLN: 10.0000 mg | INTRAVENOUS | Status: DC | PRN

## 2024-03-26 MED ORDER — LORAZEPAM 2 MG/ML IJ SOLN
1.0000 mg | Freq: Once | INTRAMUSCULAR | Status: AC
  Administered 2024-03-26: 1 mg via INTRAVENOUS
  Filled 2024-03-26: qty 1

## 2024-03-26 MED ORDER — PANTOPRAZOLE SODIUM 40 MG PO TBEC
40.0000 mg | DELAYED_RELEASE_TABLET | Freq: Every day | ORAL | Status: DC
  Administered 2024-03-26 – 2024-03-31 (×5): 40 mg via ORAL
  Filled 2024-03-26 (×5): qty 1

## 2024-03-26 MED ORDER — ATORVASTATIN CALCIUM 40 MG PO TABS
40.0000 mg | ORAL_TABLET | Freq: Every day | ORAL | Status: DC
  Administered 2024-03-26 – 2024-03-31 (×6): 40 mg via ORAL
  Filled 2024-03-26 (×6): qty 1

## 2024-03-26 MED ORDER — ASPIRIN 300 MG RE SUPP
300.0000 mg | Freq: Once | RECTAL | Status: AC
  Administered 2024-03-26: 300 mg via RECTAL
  Filled 2024-03-26: qty 1

## 2024-03-26 MED ORDER — INSULIN GLARGINE-YFGN 100 UNIT/ML ~~LOC~~ SOLN
10.0000 [IU] | Freq: Every day | SUBCUTANEOUS | Status: DC
  Administered 2024-03-26 – 2024-03-29 (×4): 10 [IU] via SUBCUTANEOUS
  Filled 2024-03-26 (×5): qty 0.1

## 2024-03-26 MED ORDER — ASPIRIN 81 MG PO TBEC
81.0000 mg | DELAYED_RELEASE_TABLET | Freq: Every day | ORAL | Status: DC
  Administered 2024-03-27 – 2024-03-31 (×5): 81 mg via ORAL
  Filled 2024-03-26 (×5): qty 1

## 2024-03-26 MED ORDER — INSULIN GLARGINE-YFGN 100 UNIT/ML ~~LOC~~ SOLN
6.0000 [IU] | Freq: Every day | SUBCUTANEOUS | Status: DC
  Filled 2024-03-26: qty 0.06

## 2024-03-26 NOTE — Progress Notes (Signed)
" °  Transition of Care (TOC) Screening Note   Patient Details  Name: Jordan Osborn Date of Birth: 04/03/1970   Transition of Care [facility]) CM/SW Contact:    Hoy DELENA Bigness, LCSW Phone Number: 03/26/2024, 12:28 PM    Transition of Care Department [facility]) has reviewed patient and no TOC needs have been identified at this time. We will continue to monitor patient advancement through interdisciplinary progression rounds. If new patient transition needs arise, please place a TOC consult.    03/26/24 1228  TOC Brief Assessment  Insurance and Status Reviewed  Patient has primary care physician Yes  Home environment has been reviewed Lives in camper [place]  Prior level of function: Independent  Prior/Current Home Services No current home services  Social Drivers of Health Review SDOH reviewed interventions complete  Readmission risk has been reviewed Yes  Transition of care needs transition of care needs identified, TOC will continue to follow    "

## 2024-03-26 NOTE — Progress Notes (Addendum)
" °   03/26/24 1928  Vitals  Temp 97.8 F (36.6 C)  Temp Source Axillary  BP 120/79  MAP (mmHg) 92  BP Location Left Arm  BP Method Automatic  Patient Position (if appropriate) Lying  Pulse Rate 94  Pulse Rate Source Dinamap  Level of Consciousness  Level of Consciousness Responds to Pain  MEWS COLOR  MEWS Score Color Yellow  Oxygen Therapy  SpO2 100 %  O2 Device Room Air  MEWS Score  MEWS Temp 0  MEWS Systolic 0  MEWS Pulse 0  MEWS RR 0  MEWS LOC 2  MEWS Score 2   Bedside report given from Brittany Bowers LPN to myself at 1920, vitals above during report. Pt CBG was 180 at this time. Only responds to a sternal rub with grimacing of the face and raises his left hand to the pain. NP Erminio Cone made aware, Charge Nurse Metta Sar made aware. "

## 2024-03-26 NOTE — Discharge Instructions (Addendum)
 1)Watch for bleeding while on Blood Thinners--watch for blood in your stool which can make your stool black, maroon, mahogany or red---, blood in your urine which can make your urine pink or red, nosebleeds , also watch for possible bruising -You are taking Aspirin  and Clopidogrel --- which are blood thinners--- be careful to avoid injury or falls  2)Avoid ibuprofen/Advil/Aleve /Motrin/Goody Powders/Naproxen /BC powders/Meloxicam/Diclofenac/Indomethacin and other Nonsteroidal anti-inflammatory medications as these will make you more likely to bleed and can cause stomach ulcers, can also cause Kidney problems.   3)Very Low-salt diet advised---Less than 2 gm of Sodium per day advised----ok to use Mrs DASH salt substitute instead of Salt  4)Patient's Niece --Ms [place]on Yearby (Cell Phone-[phone], Home Phone-[phone]) is Engineer, Civil (consulting) and Helps Patient Make Decision...    Food Agency Name: [facility] Address: [address] [place], [place] [address] Phone: [phone] Website: www.adtsrc.org Services Offered: Meals on [employer] and Meals with Friends.   Home care, at home assisted living, volunteer services, Center  for Active Retirement, transportation  Agency Name: [facility] Address: Sites vary. Must call first. Food Pantry location: [address], [place], [place] [address] [employer] Phone: [phone] Website: none Services Offered: Museum/gallery curator, utility assistance if funds available Careers Adviser for all of [place], [employer], [employer], Office Manager and [employer] for [employer] area only) Walk-in  current Id and current address verification required.  Wed-Thurs: 9:30-12:00 Agency Name: [facility] Address: [address], [place], [place] [address]  Phone: [phone] or [phone] Website: none Services Offered: Food assistance Agency Name:  [employer] Address: [address], [place], [place] [address]  Phone: [phone] or [phone]  Website: none Services Offered: Serves 1 hot meal a day at 11:00 am Monday-Sunday and 5 pm  on the second and fourth Sunday of each month Agency Name: [facility] Department of Health and [facility]  Services/Social Services  Address: [phone], [place], [place] [address] Phone: [phone] Website: [email or web address]   [email or web address] Services Offered: Sales Executive, [facility] program Agency Name: [facility] Address: [address] [place], [place] [address] Phone: [phone] Website: www.rockinghamhope.[email or web address] Services Offered: Food pantry Tuesday, Wednesday and Thursday 9am-11:30am  (need appointment) and health clinic (9:00am-11:00am)  Agency Name: [employer] [facility] Address: [address], [place] / [address], [place] Phone: [phone] [place] / [phone] [place] Website: opiniontrades.tn networkaffair.co.za Services Offered: Civil Service Fast Streamer, food pantry, soup kitchen Psychologist, Counselling) emergency financial  assistance, thrift stores, showers & hygiene products ([place]),  Christmas Assistance, spiritual help   Rent/Utilities/Housing Agency: [employer]  Clinical Biochemist Address: [address] [address] [place], [place] [address]  [address] [place], [place] [address]  Phone Number: [phone] or [phone] For the hearing-impaired - Dial 711 for [employer] [place] Services Agency Name: Raynaldo Haws. Dept. of Health and Human Services Address: [address], [place], [place] [address] Phone: [phone] Website: [email or web address]  Services Offered: Temporary financial assistance, subsidized housing, and utility  assistance  Agency Name: [employer]  Address: [address] [place], [place] [address] Phone: [phone] ext. [phone] Email: Contact: info@newrha .org Website:  footballpromos.co.nz Services Offered: Subsidized apartment rent based on income.  Agency Name: [facility] Address: [facility], [address] [place], [place] Phone: [phone]  Website: www.ccmeden.org Services Offered: Museum/gallery curator, Water Quality Scientist for all of  [place], [employer], [employer], [employer]  February 09, 2020 13 and [place] for [employer]  area only), rent assistance.  Agency Name: [facility] Address: [address] [place], [place], [place] [address] / [address],  [place] Phone: [phone] [place] / [phone]  Website: opiniontrades.tn networkaffair.co.za Services Offered: Civil Service Fast Streamer, food, showers, hygiene products utility payment  assistance, thrift shops, rental assistance, Support Groups Agency Name: [facility]  Address: [address] [place], [place] [address]  Phone: [phone] / [phone] Website: https://www.daymarkrecovery.org/ Services Offered: Support groups for Bipolar, substance abuse, anger  management, panic depression and anxiety. Mobile crisis unit,  outpatient therapy, substance abuse treatment. Agency Name: [facility]  Address: [address] [place], [place] [address]  Phone: [phone] Website: www.helpinc-centeragainstviolence.org Services Offered: Support groups for domestic violence or sexual assault, support  group for elderly women and domestic assault.  Transportation  Agency Name: [facility] [place] [facility] Address: [address], [place], [place] [address] Phone: [phone] Website: www.adtsrc.org Services Offered: Meals on [employer]. Home care, at home assisted  living, volunteer services, Center for Active Retirement,  RCATS and SKAT transportation system.  [phone] please call and dial ex [phone] ( in county ) [phone]  Agency  Name: [employer] Address: [address] [place], [place] [address] Phone: [phone] Website: www.[place]transportation.com Services Offered: Transportation for a fee.

## 2024-03-26 NOTE — Progress Notes (Signed)
 Pt slept well throughout the night once fed and assessed. CBG checked at 0000 and pts CBG was 205, Pt fully alert and sitting up in bed Indian style browsing through free movies and channels, pt complained of leg pain r/t to neuropathy, went to assess pt leg pain an pt was asleep. Pt remained alert and oriented and with CBG of 205. Pt ate bagged lunch and soda and at 0000. Consult to case management needed as pt states he has trouble paying utility bills with current housing, and running out of food at home. Pt stated he has no support and no one will help him out.

## 2024-03-26 NOTE — Plan of Care (Signed)
  Problem: Education: Goal: Ability to describe self-care measures that may prevent or decrease complications (Diabetes Survival Skills Education) will improve Outcome: Progressing Goal: Individualized Educational Video(s) Outcome: Progressing   Problem: Coping: Goal: Ability to adjust to condition or change in health will improve Outcome: Progressing   Problem: Fluid Volume: Goal: Ability to maintain a balanced intake and output will improve Outcome: Progressing   Problem: Health Behavior/Discharge Planning: Goal: Ability to identify and utilize available resources and services will improve Outcome: Progressing Goal: Ability to manage health-related needs will improve Outcome: Progressing   Problem: Metabolic: Goal: Ability to maintain appropriate glucose levels will improve Outcome: Progressing   Problem: Nutritional: Goal: Maintenance of adequate nutrition will improve Outcome: Progressing Goal: Progress toward achieving an optimal weight will improve Outcome: Progressing   Problem: Skin Integrity: Goal: Risk for impaired skin integrity will decrease Outcome: Progressing   Problem: Tissue Perfusion: Goal: Adequacy of tissue perfusion will improve Outcome: Progressing   Problem: Education: Goal: Knowledge of General Education information will improve Description: Including pain rating scale, medication(s)/side effects and non-pharmacologic comfort measures Outcome: Progressing   Problem: Health Behavior/Discharge Planning: Goal: Ability to manage health-related needs will improve Outcome: Progressing   Problem: Clinical Measurements: Goal: Ability to maintain clinical measurements within normal limits will improve Outcome: Progressing Goal: Will remain free from infection Outcome: Progressing Goal: Diagnostic test results will improve Outcome: Progressing Goal: Respiratory complications will improve Outcome: Progressing Goal: Cardiovascular complication will  be avoided Outcome: Progressing   Problem: Activity: Goal: Risk for activity intolerance will decrease Outcome: Progressing   Problem: Nutrition: Goal: Adequate nutrition will be maintained Outcome: Progressing   Problem: Coping: Goal: Level of anxiety will decrease Outcome: Progressing   Problem: Elimination: Goal: Will not experience complications related to bowel motility Outcome: Progressing Goal: Will not experience complications related to urinary retention Outcome: Progressing   Problem: Pain Managment: Goal: General experience of comfort will improve and/or be controlled Outcome: Progressing

## 2024-03-26 NOTE — Procedures (Signed)
 Patient Name: Jordan Osborn  MRN: [id number]  Epilepsy Attending: Arlin MALVA Krebs  Referring Physician/Provider: Pearlean Manus, MD  Date: 03/26/2024 Duration: 23.49 mins  Patient history: 54yo M with ams. EEG to evaluate for seizure  Level of alertness: Awake, asleep  AEDs during EEG study: None  Technical aspects: This EEG study was done with scalp electrodes positioned according to the 10-20 International system of electrode placement. Electrical activity was reviewed with band pass filter of 1-70Hz , sensitivity of 7 uV/mm, display speed of 39mm/sec with a 60Hz  notched filter applied as appropriate. EEG data were recorded continuously and digitally stored.  Video monitoring was available and reviewed as appropriate.  Description: The posterior dominant rhythm consists of 6 Hz activity of moderate voltage (25-35 uV) seen predominantly in posterior head regions, symmetric and reactive to eye opening and eye closing. Sleep was characterized by vertex waves, sleep spindles (12 to 14 Hz), maximal frontocentral region. There is continuous generalized 5 to 6 Hz theta slowing. Hyperventilation and photic stimulation were not performed.     ABNORMALITY - Continuous slow, generalized  IMPRESSION: This study is suggestive of mild diffuse encephalopathy. No seizures or epileptiform discharges were seen throughout the recording.  Lynzi Meulemans O Jerzey Komperda

## 2024-03-26 NOTE — Inpatient Diabetes Management (Signed)
 Inpatient Diabetes Program Recommendations  AACE/ADA: New Consensus Statement on Inpatient Glycemic Control (2015)  Target Ranges:  Prepandial:   less than 140 mg/dL      Peak postprandial:   less than 180 mg/dL (1-2 hours)      Critically ill patients:  140 - 180 mg/dL   Lab Results  Component Value Date   GLUCAP 293 (H) 03/26/2024   HGBA1C 11.7 (A) 09/12/2023    Latest Reference Range & Units 03/25/24 17:03 03/25/24 19:50 03/25/24 20:51 03/25/24 22:08 03/25/24 22:38 03/25/24 23:54 03/26/24 05:05 03/26/24 07:35 03/26/24 08:59  Glucose-Capillary 70 - 99 mg/dL 515 (H) 672 (H) 750 (H) 156 (H) 198 (H) 204 (H) 344 (H) 317 (H) 293 (H)  (H): Data is abnormally high  Diabetes history: DM2 Outpatient Diabetes medications:  Tresiba  8-15 units daily Novolog  8 units tid Current orders for Inpatient glycemic control: Semglee  10 units to start this hs Novolog  0-9 units tid, 0-5 units hs  Inpatient Diabetes Program Recommendations:   -Start Semglee  now and daily instead of waiting until hs. Patient did not receive Semglee  yesterday.  Thank you, Aairah Negrette E. Macon Lesesne, RN, MSN, CNS, CDCES  Diabetes Coordinator Inpatient Glycemic Control Team Team Pager [phone] (8am-5pm) 03/26/2024 9:13 AM

## 2024-03-26 NOTE — Progress Notes (Signed)
 Responded to nursing call:   Altered mental status.  Pt is somnolent, not responding to commands.  Not speaking.  Grimaces to pain  CBG 293  Subjective: ROS not possible  Vitals:   03/25/24 2338 03/26/24 0050 03/26/24 0451 03/26/24 0902  BP: (!) 147/80  112/66 124/77  Pulse: (!) 110   (!) 111  Resp: 18  18   Temp: 98.4 F (36.9 C)  (!) 97.5 F (36.4 C)   TempSrc: Oral  Oral   SpO2: 97%  95% 97%  Weight:  90.6 kg    Height:  6' (1.829 m)     CV--RRR Lung--CTA Abd--soft+BS/NT Neuro--pupils reactive to light bilateral, no gaze preference.  Grimace to pain stimuli.   Assessment/Plan: Altered mental status -EKG -STAT CT brain -VS HR 97.6--HR112, RR16--124/77, 97%RA -discussed with pt's primary provider Dr. Pearlean who will assume care     Alm Schneider, DO [facility]

## 2024-03-26 NOTE — Progress Notes (Addendum)
 " PROGRESS NOTE  Jordan Osborn, is a 54 y.o. male, DOB - [birth year], FMW:[id number]  Admit date - 03/25/2024   Admitting Physician Posey Maier, DO  Outpatient Primary MD for the patient is Doroteo Deward BIRCH, FNP  LOS - 0  Chief Complaint  Patient presents with   Hyperglycemia      Brief Narrative:  54 y.o. male with medical history significant of obesity, diabetes mellitus who was admitted on 03/25/2024 with uncontrolled DM with metabolic acidosis and elevated anion gap required IV insulin  therapy initially - On 03/27/2023 patient had episode of unresponsiveness rapid response was called and CT head was unremarkable    -Assessment and Plan: 1) acute metabolic encephalopathy----CT head unremarkable today MRI brain and MRA head as well as carotid artery Dopplers  are pending--- part of stroke workup --Start aspirin  therapy, atorvastatin  -- EEG with mild diffuse encephalopathy without epileptiform findings -- Echo requested - Official neurology consult requested -NIH stroke scale - Check serum ammonia especially given elevated LFTs  2)DM2--A1c 13.2 consistent with uncontrolled diabetes with hyperglycemia - bicarb up to 24 from 20 - Anion gap is down to 15 from 23 - Off IV insulin  at this time  --continue subcutaneous insulin  Use Novolog /Humalog Sliding scale insulin  with Accu-Cheks/Fingersticks as ordered   3)HTN--BP has been soft - Due to concerns about possible acute stroke hold amlodipine , Coreg and lisinopril at this time  may use IV labetalol  when necessary  Every 4 hours for systolic blood pressure over 180 mmhg  4)Social/Ethics--- Attempted to reach relatives [employer] -[phone]), and Annabella [phone] --Left voicemails for family members to return call -- 5) mild LFT elevation--right upper quadrant ultrasound consistent with status post prior cholecystectomy without intra or extrahepatic biliary duct dilatation  --monitor close  6)GERD--- continue  Protonix   Status is: Inpatient   Disposition: The patient is from: Home              Anticipated d/c is to: Home              Anticipated d/c date is: 2 days              Patient currently is not medically stable to d/c. Barriers: Not Clinically Stable-   Code Status :  -  Code Status: Full Code   Family Communication:   Unable to reach family at this time -Attempted to reach relatives Quillian Brinks -[phone]), and Annabella [phone] --Left voicemails for family members to return call  DVT Prophylaxis  :   - SCDs  enoxaparin  (LOVENOX ) injection 40 mg Start: 03/26/24 1000 SCDs Start: 03/25/24 2229   Lab Results  Component Value Date   PLT 239 03/26/2024    Inpatient Medications  Scheduled Meds:  [START ON 03/27/2024] aspirin  EC  81 mg Oral Q breakfast   atorvastatin   40 mg Oral Daily   enoxaparin  (LOVENOX ) injection  40 mg Subcutaneous Q24H   insulin  aspart  0-5 Units Subcutaneous QHS   insulin  aspart  0-9 Units Subcutaneous TID WC   insulin  glargine-yfgn  10 Units Subcutaneous QHS   pantoprazole   40 mg Oral Daily   Continuous Infusions:  lactated ringers      PRN Meds:.acetaminophen  **OR** acetaminophen , dextrose , labetalol , ondansetron  **OR** ondansetron  (ZOFRAN ) IV   Anti-infectives (From admission, onward)    None         Subjective: Nezar Wool today has no fevers, no emesis,  ,   - Attempted to reach relatives [employer] -[phone]), and Tiffany [phone] --Left voicemails for family  members to return call - Remains sleepy on and off -Denies headaches or chest pains   Objective: Vitals:   03/26/24 0902 03/26/24 1317 03/26/24 1928 03/26/24 1942  BP: 126/76 138/84 120/79 118/79  Pulse: (!) 103 (!) 109 94 91  Resp: 20 17  18   Temp: 99.4 F (37.4 C) 100.2 F (37.9 C) 97.8 F (36.6 C) 98.7 F (37.1 C)  TempSrc: Axillary Axillary Axillary Axillary  SpO2: 94% 99% 100% 97%  Weight:      Height:         Intake/Output Summary (Last 24 hours) at 03/26/2024 2012 Last data filed at 03/26/2024 0800 Gross per 24 hour  Intake 2091.76 ml  Output --  Net 2091.76 ml   Filed Weights   03/25/24 1657 03/26/24 0050  Weight: 91.2 kg 90.6 kg    Physical Exam Gen:-Sleepy on and off HEENT:- [place].AT, No sclera icterus Neck-Supple Neck,No JVD,.  Lungs-  CTAB , fair symmetrical air movement CV- S1, S2 normal, regular  Abd-  +ve B.Sounds, Abd Soft, No tenderness,    Extremity/Skin:- No  edema, pedal pulses present  Psych-affect is flat, oriented x3 when awake otherwise Sleepy on and off Neuro-Sleepy but no obvious neuro deficits at this time however exam is limited as patient is quite sleepy and not following instructions appropriately at times no tremors  Data Reviewed: I have personally reviewed following labs and imaging studies  CBC: Recent Labs  Lab 03/21/24 1931 03/22/24 1440 03/25/24 1728 03/26/24 0335  WBC 6.0 6.3 9.1 9.8  NEUTROABS 4.3 4.6 7.2  --   HGB 15.7 14.4 17.4* 14.5  HCT 46.4 42.6 51.9 42.3  MCV 86.7 88.0 88.3 86.7  PLT 219 218 260 239   Basic Metabolic Panel: Recent Labs  Lab 03/21/24 1931 03/22/24 1440 03/25/24 1728 03/26/24 0335  NA 136 134* 133* 134*  K 3.4* 4.1 4.0 3.6  CL 95* 93* 90* 95*  CO2 27 23 20* 24  GLUCOSE 385* 660* 436* 330*  BUN 10 14 20  21*  CREATININE 0.95 1.14 1.20 1.23  CALCIUM  9.9 9.6 9.5 8.7*  MG  --   --   --  1.8  PHOS  --   --   --  2.9   GFR: Estimated Creatinine Clearance: 74.5 mL/min (by C-G formula based on SCr of 1.23 mg/dL). Liver Function Tests: Recent Labs  Lab 03/21/24 1931 03/25/24 1728 03/26/24 0335  AST 19 97* 85*  ALT 18 55* 47*  ALKPHOS 188* 209* 158*  BILITOT 0.5 0.6 0.5  PROT 7.4 7.8 6.2*  ALBUMIN 4.3 4.3 3.6   HbA1C: Recent Labs    03/25/24 1728  HGBA1C 13.2*   Radiology Studies: EEG adult Result Date: 03/26/2024 Shelton Arlin KIDD, MD     03/26/2024  3:47 PM Patient Name: Jordan Osborn MRN: [id number]  Epilepsy Attending: Arlin KIDD Shelton Referring Physician/Provider: Pearlean Manus, MD Date: 03/26/2024 Duration: 23.49 mins Patient history: 54yo M with ams. EEG to evaluate for seizure Level of alertness: Awake, asleep AEDs during EEG study: None Technical aspects: This EEG study was done with scalp electrodes positioned according to the 10-20 International system of electrode placement. Electrical activity was reviewed with band pass filter of 1-70Hz , sensitivity of 7 uV/mm, display speed of 103mm/sec with a 60Hz  notched filter applied as appropriate. EEG data were recorded continuously and digitally stored.  Video monitoring was available and reviewed as appropriate. Description: The posterior dominant rhythm consists of 6 Hz activity of moderate voltage (25-35 uV) seen  predominantly in posterior head regions, symmetric and reactive to eye opening and eye closing. Sleep was characterized by vertex waves, sleep spindles (12 to 14 Hz), maximal frontocentral region. There is continuous generalized 5 to 6 Hz theta slowing. Hyperventilation and photic stimulation were not performed.   ABNORMALITY - Continuous slow, generalized IMPRESSION: This study is suggestive of mild diffuse encephalopathy. No seizures or epileptiform discharges were seen throughout the recording. Priyanka O Yadav   US  Abdomen Limited RUQ (LIVER/GB) Result Date: 03/26/2024 EXAM: Right Upper Quadrant Abdominal Ultrasound 03/26/2024 11:11:50 AM TECHNIQUE: Real-time ultrasonography of the right upper quadrant of the abdomen was performed. COMPARISON: 03/25/2024 CLINICAL HISTORY: Abnormal LFTs (liver function tests). FINDINGS: LIVER: Normal echogenicity. No intrahepatic biliary ductal dilatation. No evidence of mass. Hepatopetal flow in the portal vein. BILIARY SYSTEM: Cholecystectomy. No intrahepatic or extrahepatic biliary ductal dilation. Common bile duct is within normal limits measuring 4.4 mm. OTHER: No right upper quadrant ascites.  IMPRESSION: 1. Cholecystectomy. No intrahepatic or extrahepatic biliary ductal dilation. Electronically signed by: Rogelia Myers MD 03/26/2024 03:09 PM EST RP Workstation: [id number]   CT HEAD WO CONTRAST ( ) Result Date: 03/26/2024 EXAM: CT HEAD WITHOUT CONTRAST 03/26/2024 09:29:56 AM TECHNIQUE: CT of the head was performed without the administration of intravenous contrast. Automated exposure control, iterative reconstruction, and/or weight based adjustment of the mA/kV was utilized to reduce the radiation dose to as low as reasonably achievable. COMPARISON: Head CT 10/20/2021. CLINICAL HISTORY: 54 year old male. Mental status change, unknown cause. Hospitalized with hyperglycemia. FINDINGS: BRAIN AND VENTRICLES: No acute hemorrhage. No evidence of acute infarct. 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 Punctate dystrophic calcification of the left ventral pons is chronic. Calcified atherosclerosis at the skull base. Background gray white differentiation is stable and largely normal. Probable small chronic dilated perivascular spaces in some of the superior frontal lobes subcortical white matter. No suspicious intracranial vascular hyperdensity. No hydrocephalus. No extra-axial collection. No mass effect or midline shift. ORBITS: Rightward gaze deviation. SINUSES: No acute abnormality. SOFT TISSUES AND SKULL: Trace left mastoid effusion, chronic. No acute soft tissue abnormality. No skull fracture. IMPRESSION: 1. No acute intracranial abnormality. 2. 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 Electronically signed by: Helayne Hurst MD 03/26/2024 09:39 AM EST RP Workstation: [id number]   CT ABDOMEN PELVIS WO CONTRAST Result Date: 03/25/2024 CLINICAL DATA:  Status post fall. EXAM: CT ABDOMEN AND PELVIS WITHOUT CONTRAST TECHNIQUE: Multidetector CT imaging of the abdomen and pelvis was performed following the  standard protocol without IV contrast. RADIATION DOSE REDUCTION: This exam was performed according to the departmental dose-optimization program which includes automated exposure control, adjustment of the mA and/or kV according to patient size and/or use of iterative reconstruction technique. COMPARISON:  October 25, 2022 FINDINGS: Lower chest: No acute abnormality. Hepatobiliary: No focal liver abnormality is seen. Status post cholecystectomy. No biliary dilatation. Pancreas: Unremarkable. No pancreatic ductal dilatation or surrounding inflammatory changes. Spleen: Normal in size without focal abnormality. Adrenals/Urinary Tract: Adrenal glands are unremarkable. Kidneys are normal in size, without renal calculi, focal lesion, or hydronephrosis. There is mild, stable nonspecific bilateral perinephric inflammatory fat stranding. Bladder is unremarkable. Stomach/Bowel: Stomach is within normal limits. The appendix is not identified. Surgical clips are seen within the right lower quadrant. No evidence of bowel wall thickening, distention, or inflammatory changes. Vascular/Lymphatic: Aortic atherosclerosis. No enlarged abdominal or pelvic lymph nodes. Reproductive: Prostate is unremarkable. Other: No abdominal wall hernia or abnormality. No abdominopelvic ascites.  Musculoskeletal: No acute or significant osseous findings. IMPRESSION: 1. No acute or active process within the abdomen or pelvis. 2. Evidence of prior cholecystectomy. 3. Aortic atherosclerosis. Electronically Signed   By: Suzen Dials M.D.   On: 03/25/2024 19:01   Scheduled Meds:  [START ON 03/27/2024] aspirin  EC  81 mg Oral Q breakfast   atorvastatin   40 mg Oral Daily   enoxaparin  (LOVENOX ) injection  40 mg Subcutaneous Q24H   insulin  aspart  0-5 Units Subcutaneous QHS   insulin  aspart  0-9 Units Subcutaneous TID WC   insulin  glargine-yfgn  10 Units Subcutaneous QHS   pantoprazole   40 mg Oral Daily   Continuous Infusions:  lactated ringers        LOS: 0 days   Rendall Carwin M.D on 03/26/2024 at 8:12 PM  Go to [email or web address] - for contact info  [facility] - Office  [phone]  If 7PM-7AM, please contact night-coverage [email or web address] 03/26/2024, 8:12 PM    "

## 2024-03-26 NOTE — Progress Notes (Signed)
" °   03/26/24 2144  Pre-Screen- If YES to any of the following, STOP the screen, keep NPO, and place order for SLP eval and treat.   Home diet required thickened liquids No - Proceed  Trach tube present No - Proceed  Radiation to Head/Neck No - Proceed  Patient Readiness- If YES to any of the following, WAIT to screen, keep NPO, and place order for SLP eval and treat. May rescreen if clinical improvement WITHIN 24h.    Is patient lethargic or unable to stay alert/awake? No - Proceed  HOB restricted to <30 degrees No - Proceed  NPO for planned procedure No - Proceed  Aspiration Risk Assessment  Is patient oriented to name, place, or year? Yes - Proceed  Able to open mouth, stick out tongue, or smile Yes - Proceed  Able to seal lips Yes - Proceed  Able to move tongue from side to side Yes - Proceed  Face is symmetric Yes - Proceed  Mandatory oral care performed Yes  3 oz Water Challenge  Does the patient stop drinking? No - Proceed  Does the patient cough/choke? No - Proceed  Document PASS or FAIL PASS- Obtain Diet Order   Pt is awake now, Oriented X4. NIH Stroke Scale score of 9. Able to answer questions, hold a conversation, and perform tasks. Jordan Fairly NP and Metta Sar Charge Nurse aware. "

## 2024-03-26 NOTE — Progress Notes (Signed)
 EEG complete - results pending

## 2024-03-27 ENCOUNTER — Inpatient Hospital Stay (HOSPITAL_COMMUNITY)

## 2024-03-27 DIAGNOSIS — I6389 Other cerebral infarction: Secondary | ICD-10-CM | POA: Diagnosis not present

## 2024-03-27 DIAGNOSIS — I6340 Cerebral infarction due to embolism of unspecified cerebral artery: Secondary | ICD-10-CM

## 2024-03-27 DIAGNOSIS — I10 Essential (primary) hypertension: Secondary | ICD-10-CM | POA: Diagnosis not present

## 2024-03-27 DIAGNOSIS — E1165 Type 2 diabetes mellitus with hyperglycemia: Secondary | ICD-10-CM | POA: Diagnosis not present

## 2024-03-27 DIAGNOSIS — Z794 Long term (current) use of insulin: Secondary | ICD-10-CM | POA: Diagnosis not present

## 2024-03-27 DIAGNOSIS — I639 Cerebral infarction, unspecified: Secondary | ICD-10-CM | POA: Diagnosis present

## 2024-03-27 DIAGNOSIS — K219 Gastro-esophageal reflux disease without esophagitis: Secondary | ICD-10-CM | POA: Diagnosis not present

## 2024-03-27 LAB — COMPREHENSIVE METABOLIC PANEL WITH GFR
ALT: 36 U/L (ref 0–44)
AST: 59 U/L — ABNORMAL HIGH (ref 15–41)
Albumin: 3.4 g/dL — ABNORMAL LOW (ref 3.5–5.0)
Alkaline Phosphatase: 133 U/L — ABNORMAL HIGH (ref 38–126)
Anion gap: 9 (ref 5–15)
BUN: 18 mg/dL (ref 6–20)
CO2: 28 mmol/L (ref 22–32)
Calcium: 8.8 mg/dL — ABNORMAL LOW (ref 8.9–10.3)
Chloride: 98 mmol/L (ref 98–111)
Creatinine, Ser: 1.06 mg/dL (ref 0.61–1.24)
GFR, Estimated: >60 mL/min
Glucose, Bld: 336 mg/dL — ABNORMAL HIGH (ref 70–99)
Potassium: 3.5 mmol/L (ref 3.5–5.1)
Sodium: 134 mmol/L — ABNORMAL LOW (ref 135–145)
Total Bilirubin: 0.5 mg/dL (ref 0.0–1.2)
Total Protein: 5.8 g/dL — ABNORMAL LOW (ref 6.5–8.1)

## 2024-03-27 LAB — ECHOCARDIOGRAM COMPLETE
AR max vel: 2.49 cm2
AV Area VTI: 2.7 cm2
AV Area mean vel: 2.49 cm2
AV Mean grad: 4.6 mmHg
AV Peak grad: 9.9 mmHg
Ao pk vel: 1.57 m/s
Area-P 1/2: 3.16 cm2
Calc EF: 64 %
Height: 72 in
S' Lateral: 3 cm
Single Plane A2C EF: 66.5 %
Single Plane A4C EF: 59.6 %
Weight: 3195.79 [oz_av]

## 2024-03-27 LAB — AMMONIA: Ammonia: 26 umol/L (ref 9–35)

## 2024-03-27 LAB — GLUCOSE, CAPILLARY
Glucose-Capillary: 297 mg/dL — ABNORMAL HIGH (ref 70–99)
Glucose-Capillary: 273 mg/dL — ABNORMAL HIGH (ref 70–99)
Glucose-Capillary: 296 mg/dL — ABNORMAL HIGH (ref 70–99)
Glucose-Capillary: 306 mg/dL — ABNORMAL HIGH (ref 70–99)

## 2024-03-27 MED ORDER — LIVING WELL WITH DIABETES BOOK: Freq: Once | Status: AC

## 2024-03-27 MED ORDER — CLOPIDOGREL BISULFATE 75 MG PO TABS
75.0000 mg | ORAL_TABLET | Freq: Every day | ORAL | Status: DC
  Administered 2024-03-27 – 2024-03-31 (×5): 75 mg via ORAL
  Filled 2024-03-27 (×5): qty 1

## 2024-03-27 NOTE — Progress Notes (Addendum)
 Inpatient Rehab Admissions Coordinator:  Consult received. Attempted to contact pt in room and on personal phone. No one answered and not able to leave a message. Will continue to follow.  1426: Able to talk to pt on phone. Discussed CIR goals and expectations. Discussed average length of stay, insurance authorization requirement and discharge home after completion of CIR; however, unsure of pt's ability to understand the conversation. Pt did indicate that he was interested in pursuing CIR. After discussion with NSG and TOC, note pt appears to be unhoused without caregiver support. CIR will not pursue pt. If TOC is able to identify support for pt, please re-consult CIR.   [place] Yvone Cohens, MS, CCC-SLP Admissions Coordinator [phone]

## 2024-03-27 NOTE — Progress Notes (Addendum)
 Pt ate 2 tv dinner macaroni plates, 2 cans of chicken noodle soup, 4 fruit cups, cup of crisp rice cereal, and a turkey sandwich. Total of fluids drank: 3 cups of water, 4 cups of apple juice, a glucerna drink, and 2 diet ginger ales. Pt was assisted with walker to the bathroom, had a large bowel movement (Type 6). Gave pt a shower, full linen change.  Alert and Oriented X4.

## 2024-03-27 NOTE — NC FL2 (Signed)
 " [place]  MEDICAID FL2 LEVEL OF CARE FORM     IDENTIFICATION  Patient Name: Jordan Osborn Birthdate: 1970/07/02 Sex: male Admission Date (Current Location): 03/25/2024  [facility] and [place] Number:  [employer] and Address:  [facility],  [address], [place] [address]      Provider Number: [id number]  Attending Physician Name and Address:  Pearlean Manus, MD  Relative Name and Phone Number:       Current Level of Care: Hospital Recommended Level of Care: Skilled Nursing Facility Prior Approval Number:    Date Approved/Denied:   PASRR Number: [id number]  Discharge Plan: SNF    Current Diagnoses: Patient Active Problem List   Diagnosis Date Noted   LFT elevation 03/26/2024   HTN (hypertension) 03/26/2024   GERD (gastroesophageal reflux disease) 03/26/2024   Type 2 diabetes mellitus with hyperglycemia (HCC) 03/25/2024   Intractable nausea and vomiting 03/04/2021   Lactic acidosis    CLOS FRACTURE MID/PROXIMAL PHALANX/PHALANG HAND 08/05/2007    Orientation RESPIRATION BLADDER Height & Weight     Self, Time, Situation, Place  Normal Continent Weight: 199 lb 11.8 oz (90.6 kg) Height:  6' (182.9 cm)  BEHAVIORAL SYMPTOMS/MOOD NEUROLOGICAL BOWEL NUTRITION STATUS      Continent Diet (See DC summary)  AMBULATORY STATUS COMMUNICATION OF NEEDS Skin   Limited Assist Verbally Normal                       Personal Care Assistance Level of Assistance  Bathing, Feeding, Dressing Bathing Assistance: Limited assistance Feeding assistance: Limited assistance Dressing Assistance: Limited assistance     Functional Limitations Info  Sight, Hearing, Speech Sight Info: Impaired (eyeglasses) Hearing Info: Adequate Speech Info: Adequate    SPECIAL CARE FACTORS FREQUENCY  PT (By licensed PT), OT (By licensed OT)     PT Frequency: 5x/wk OT Frequency: 5x/wk            Contractures Contractures Info: Not present    Additional Factors Info   Code Status, Allergies Code Status Info: FULL Allergies Info: Iodine , Benadryl (Diphenhydramine Hcl), Morphine  And Codeine, Omnipaque  (Iohexol )           Current Medications (03/27/2024):  This is the current hospital active medication list Current Facility-Administered Medications  Medication Dose Route Frequency Provider Last Rate Last Admin   acetaminophen  (TYLENOL ) tablet 650 mg  650 mg Oral Q6H PRN Adefeso, Oladapo, DO   650 mg at 03/26/24 2201   Or   acetaminophen  (TYLENOL ) suppository 650 mg  650 mg Rectal Q6H PRN Adefeso, Oladapo, DO   650 mg at 03/26/24 1323   aspirin  EC tablet 81 mg  81 mg Oral Q breakfast Emokpae, Courage, MD   81 mg at 03/27/24 9193   atorvastatin  (LIPITOR) tablet 40 mg  40 mg Oral Daily Emokpae, Courage, MD   40 mg at 03/27/24 9193   clopidogrel  (PLAVIX ) tablet 75 mg  75 mg Oral Daily Yadav, Priyanka O, MD   75 mg at 03/27/24 1202   dextrose  50 % solution 0-50 mL  0-50 mL Intravenous PRN Neysa Thersia RAMAN, PA-C   50 mL at 03/25/24 2210   enoxaparin  (LOVENOX ) injection 40 mg  40 mg Subcutaneous Q24H Adefeso, Oladapo, DO   40 mg at 03/27/24 0806   insulin  aspart (novoLOG ) injection 0-5 Units  0-5 Units Subcutaneous QHS Adefeso, Oladapo, DO       insulin  aspart (novoLOG ) injection 0-9 Units  0-9 Units Subcutaneous TID WC Adefeso,  Oladapo, DO   5 Units at 03/27/24 1202   insulin  glargine-yfgn (SEMGLEE ) injection 10 Units  10 Units Subcutaneous QHS Pearlean Manus, MD   10 Units at 03/26/24 2120   labetalol  (NORMODYNE ) injection 10 mg  10 mg Intravenous Q4H PRN Pearlean Manus, MD       lactated ringers  infusion   Intravenous Continuous Emokpae, Courage, MD 125 mL/hr at 03/26/24 2132 125 mL/hr at 03/26/24 2132   ondansetron  (ZOFRAN ) tablet 4 mg  4 mg Oral Q6H PRN Adefeso, Oladapo, DO       Or   ondansetron  (ZOFRAN ) injection 4 mg  4 mg Intravenous Q6H PRN Adefeso, Oladapo, DO       pantoprazole  (PROTONIX ) EC tablet 40 mg  40 mg Oral Daily Emokpae, Courage, MD   40  mg at 03/27/24 9193     Discharge Medications: Please see discharge summary for a list of discharge medications.  Relevant Imaging Results:  Relevant Lab Results:   Additional Information SSN: [id number]  Hoy DELENA Bigness, LCSW     "

## 2024-03-27 NOTE — Progress Notes (Signed)

## 2024-03-27 NOTE — Plan of Care (Signed)
   Problem: Education: Goal: Knowledge of General Education information will improve Description: Including pain rating scale, medication(s)/side effects and non-pharmacologic comfort measures Outcome: Progressing   Problem: Activity: Goal: Risk for activity intolerance will decrease Outcome: Progressing   Problem: Nutrition: Goal: Adequate nutrition will be maintained Outcome: Progressing

## 2024-03-27 NOTE — TOC Progression Note (Signed)
 Transition of Care [facility]) - Progression Note    Patient Details  Name: Jordan Osborn MRN: [id number] Date of Birth: 1970-10-16  Transition of [facility]) CM/SW Contact  Hoy DELENA Bigness, LCSW Phone Number: 03/27/2024, 3:40 PM  Clinical Narrative:    Pt not a candidate for CIR.  Pt lives in a camper on his driveway and owns the house however, does not stay in the house due to it needing repairs, such as a new roof. Pt reports he does have access to electricity. Pt denies having any family/friend contacts.  Pt is agreeable to SNF placement and prefers placement in [place], [place], on [place]. Referrals have been sent out and currently awaiting bed offers.                      Expected Discharge Plan and Services                                               Social Drivers of Health (SDOH) Interventions SDOH Screenings   Food Insecurity: Food Insecurity Present (03/26/2024)  Housing: High Risk (03/26/2024)  Transportation Needs: Unmet Transportation Needs (03/26/2024)  Utilities: At Risk (03/26/2024)  Financial Resource Strain: Low Risk  (07/09/2023)   Received from [facility]  Tobacco Use: Low Risk (03/25/2024)    Readmission Risk Interventions     No data to display

## 2024-03-27 NOTE — Progress Notes (Signed)
 SLP Cancellation Note  Patient Details Name: Jordan Osborn MRN: [id number] DOB: 22-Jun-1970   Cancelled treatment:       Reason Eval/Treat Not Completed: SLP screened, no needs identified, will sign off. Pt passed [place] and is tolerating regular/thin diet. Please re-order BSE if clinically indicated. Thank you,  [name] H. [name], CCC-SLP Speech Language Pathologist    [name] 03/27/2024, 3:54 PM
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
## 2024-03-27 NOTE — Plan of Care (Signed)
" °  Problem: Acute Rehab PT Goals(only PT should resolve) Goal: Pt Will Go Supine/Side To Sit Outcome: Progressing Flowsheets (Taken 03/27/2024 1233) Pt will go Supine/Side to Sit:  with minimal assist  with contact guard assist Goal: Patient Will Transfer Sit To/From Stand Outcome: Progressing Flowsheets (Taken 03/27/2024 1233) Patient will transfer sit to/from stand:  with contact guard assist  with minimal assist Goal: Pt Will Transfer Bed To Chair/Chair To Bed Outcome: Progressing Flowsheets (Taken 03/27/2024 1233) Pt will Transfer Bed to Chair/Chair to Bed:  with contact guard assist  with min assist Goal: Pt Will Ambulate Outcome: Progressing Flowsheets (Taken 03/27/2024 1233) Pt will Ambulate:  50 feet  with contact guard assist  with minimal assist  with rolling walker  with least restrictive assistive device   12:34 PM, 03/27/24 Lynwood Music, MPT Physical Therapist with [facility] [phone] office [phone] mobile phone  "

## 2024-03-27 NOTE — Plan of Care (Signed)

## 2024-03-27 NOTE — Plan of Care (Signed)
" °  Problem: Acute Rehab OT Goals (only OT should resolve) Goal: Pt. Will Perform Eating Flowsheets (Taken 03/27/2024 1040) Pt Will Perform Eating:  with modified independence  Independently Goal: Pt. Will Perform Grooming Flowsheets (Taken 03/27/2024 1040) Pt Will Perform Grooming:  with modified independence  standing Goal: Pt. Will Perform Upper Body Dressing Flowsheets (Taken 03/27/2024 1040) Pt Will Perform Upper Body Dressing: with modified independence Goal: Pt. Will Perform Lower Body Dressing Flowsheets (Taken 03/27/2024 1040) Pt Will Perform Lower Body Dressing: with modified independence Goal: Pt. Will Transfer To Toilet Flowsheets (Taken 03/27/2024 1040) Pt Will Transfer to Toilet:  with modified independence  ambulating Goal: Pt. Will Perform Toileting-Clothing Manipulation Flowsheets (Taken 03/27/2024 1040) Pt Will Perform Toileting - Clothing Manipulation and hygiene: with modified independence Goal: Pt. Will Perform Tub/Shower Transfer Flowsheets (Taken 03/27/2024 1040) Pt Will Perform Tub/Shower Transfer: with modified independence   Problem: Acute Rehab OT Goals (only OT should resolve) Goal: Pt/Caregiver Will Perform Home Exercise Program Flowsheets (Taken 03/27/2024 1040) Pt/caregiver will Perform Home Exercise Program:  Increased strength  Increased ROM  Left upper extremity  Independently  Imanni Burdine OT, MOT  "
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
## 2024-03-27 NOTE — Inpatient Diabetes Management (Signed)
 Inpatient Diabetes Program Recommendations  AACE/ADA: New Consensus Statement on Inpatient Glycemic Control (2015)  Target Ranges:  Prepandial:   less than 140 mg/dL      Peak postprandial:   less than 180 mg/dL (1-2 hours)      Critically ill patients:  140 - 180 mg/dL   Lab Results  Component Value Date   GLUCAP 297 (H) 03/27/2024   HGBA1C 13.2 (H) 03/25/2024    Review of Glycemic Control  Latest Reference Range & Units 03/26/24 08:59 03/26/24 11:17 03/26/24 13:32 03/26/24 17:37 03/26/24 19:27 03/27/24 07:41  Glucose-Capillary 70 - 99 mg/dL 706 (H) 681 (H) 762 (H) 186 (H) 180 (H) 297 (H)  (H): Data is abnormally high  Diabetes history: DM2  Outpatient Diabetes medications:  Tresiba  8-15 units every day Novolog   8 units TID  Current orders for Inpatient glycemic control:  Semglee  10 units every day Novolog  0-9 units TID  Inpatient Diabetes Program Recommendations:    Semglee  15 units at bedtime Novolog  4 units TID with meals if he consumes at least 50% Ordered LWWDM booklet-A1C 13.2% Will call patient today about DM management  Thank you, Wyvonna Pinal, MSN, CDCES Diabetes Coordinator Inpatient Diabetes Program [phone] (team pager from 8a-5p)

## 2024-03-27 NOTE — Consult Note (Signed)
 I connected with  Jordan Osborn on 03/27/24 by a video enabled telemedicine application and verified that I am speaking with the correct person using two identifiers.   I discussed the limitations of evaluation and management by telemedicine. The patient expressed understanding and agreed to proceed.  Location of patient: [facility] Location of physician: [facility]   Neurology Consultation Reason for Consult: Stroke Referring Physician: Dr. Rendall Carwin  CC: Stroke  History is obtained from: Patient, chart review  HPI: Jordan Osborn is a 54 y.o. male with medical history of diabetes and hypertension.  Patient presented on 03/25/2024 because of high blood sugar and neuropathy in his feet due to lack of his diabetes medications for more than a month.  Also reported increased frequency of falls due to neuropathy.  On arrival, patient was alert and oriented.  However yesterday morning he was noted to be altered, not responding to commands, only grimacing to painful stimuli.  CT head was done which did not show any acute abnormality.  MRI brain was then done which showed acute strokes.  Therefore neurology was consulted for further recommendations.  Last known normal: Unclear but sometime yesterday morning No tPA as unclear last known normal No thrombectomy as no large vessel occlusion Unclear if event happened at home or hospital because of unclear last known normal mRS 3   ROS: All other systems reviewed and negative except as noted in the HPI.   Past Medical History:  Diagnosis Date   Diabetes mellitus without complication (HCC)    Hypertension     History reviewed. No pertinent family history.  Social History:  reports that he has never smoked. He has never used smokeless tobacco. He reports that he does not currently use alcohol. He reports that he does not use drugs.   Medications Prior to Admission  Medication Sig Dispense Refill Last Dose/Taking   amLODipine   (NORVASC ) 5 MG tablet Take 1 tablet (5 mg total) by mouth daily. (Patient not taking: Reported on 09/12/2023) 60 tablet 1    aspirin  81 MG chewable tablet Chew 162 mg by mouth daily. (Patient not taking: Reported on 09/12/2023)      atorvastatin  (LIPITOR) 40 MG tablet Take 40 mg by mouth daily.      azelastine (ASTELIN) 0.1 % nasal spray Place into both nostrils. (Patient not taking: Reported on 10/20/2021)      benzonatate  (TESSALON ) 100 MG capsule Take 1 capsule (100 mg total) by mouth every 8 (eight) hours. (Patient not taking: Reported on 09/12/2023) 21 capsule 0    carvedilol (COREG) 3.125 MG tablet Take 3.125 mg by mouth 2 (two) times daily.      clotrimazole  (LOTRIMIN ) 1 % cream Apply to affected area 2 times daily 60 g 0    DULoxetine (CYMBALTA) 30 MG capsule Take 60 mg by mouth daily.      gabapentin  (NEURONTIN ) 300 MG capsule Take 2 capsules (600 mg total) by mouth 3 (three) times daily. 90 capsule 0    glucose monitoring kit (FREESTYLE) monitoring kit 1 each by Does not apply route as needed for other. 1 each 0    hydrocortisone  cream 1 % Apply to affected area 2 times daily 56 g 0    Insulin  Aspart FlexPen (NOVOLOG ) 100 UNIT/ML Inject 8 Units into the skin 3 (three) times daily with meals. 30 mL 3    insulin  degludec (TRESIBA  FLEXTOUCH) 100 UNIT/ML FlexTouch Pen Inject 8-15 Units into the skin at bedtime. 13.5 mL  0    labetalol  (NORMODYNE ) 100 MG tablet Take by mouth daily. (Patient not taking: Reported on 10/20/2021)      lisinopril (ZESTRIL) 20 MG tablet Take 20 mg by mouth daily.      losartan  (COZAAR ) 50 MG tablet Take 1 tablet (50 mg total) by mouth daily. (Patient not taking: Reported on 09/12/2023) 30 tablet 2    naproxen  (NAPROSYN ) 500 MG tablet Take 1 tablet (500 mg total) by mouth 2 (two) times daily. 30 tablet 0    nitroGLYCERIN (NITROSTAT) 0.4 MG SL tablet Place 0.4 mg under the tongue every 5 (five) minutes as needed for chest pain.      pantoprazole  (PROTONIX ) 40 MG tablet Take  1 tablet (40 mg total) by mouth 2 (two) times daily. 60 tablet 1    povidone-iodine  (BETADINE ) 10 % external solution Apply 1 Application topically as needed for wound care. 480 mL 0    pregabalin (LYRICA) 100 MG capsule Take 100 mg by mouth 2 (two) times daily. (Patient not taking: Reported on 09/12/2023)      traMADol (ULTRAM) 50 MG tablet Take 50 mg by mouth every 4 (four) hours as needed. (Patient not taking: Reported on 09/12/2023)         Exam: Current vital signs: BP 134/73 (BP Location: Left Arm)   Pulse 94   Temp 97.6 F (36.4 C) (Oral)   Resp 18   Ht 6' (1.829 m)   Wt 90.6 kg   SpO2 95%   BMI 27.09 kg/m  Vital signs in last 24 hours: Temp:  [97.6 F (36.4 C)-100.2 F (37.9 C)] 97.6 F (36.4 C) (01/23 0616) Pulse Rate:  [89-109] 94 (01/23 0616) Resp:  [17-18] 18 (01/23 0616) BP: (118-151)/(73-86) 134/73 (01/23 0616) SpO2:  [95 %-100 %] 95 % (01/23 0616)   Physical Exam  Constitutional: Appears well-developed and well-nourished.  Psych: Affect appropriate to situation Neuro: AO x 3, cranial nerves grossly intact, no aphasia or dysarthria, antigravity strength in upper extremities without drift, sensory intact to light touch except baseline peripheral neuropathy, FTN intact bilaterally  NIHSS 0   I have reviewed labs in epic and the results pertinent to this consultation are: CBC:  Recent Labs  Lab 03/22/24 1440 03/25/24 1728 03/26/24 0335  WBC 6.3 9.1 9.8  NEUTROABS 4.6 7.2  --   HGB 14.4 17.4* 14.5  HCT 42.6 51.9 42.3  MCV 88.0 88.3 86.7  PLT 218 260 239    Basic Metabolic Panel:  Lab Results  Component Value Date   NA 134 (L) 03/27/2024   K 3.5 03/27/2024   CO2 28 03/27/2024   GLUCOSE 336 (H) 03/27/2024   BUN 18 03/27/2024   CREATININE 1.06 03/27/2024   CALCIUM  8.8 (L) 03/27/2024   GFRNONAA >60 03/27/2024   GFRAA >60 10/08/2019   Lipid Panel:  Lab Results  Component Value Date   LDLCALC 100 03/07/2023   HgbA1c:  Lab Results  Component  Value Date   HGBA1C 13.2 (H) 03/25/2024   Urine Drug Screen:     Component Value Date/Time   LABOPIA NEGATIVE 03/25/2024 1727   COCAINSCRNUR NEGATIVE 03/25/2024 1727   LABBENZ NEGATIVE 03/25/2024 1727   AMPHETMU NEGATIVE 03/25/2024 1727   THCU NEGATIVE 03/25/2024 1727   LABBARB NEGATIVE 03/25/2024 1727    Alcohol Level     Component Value Date/Time   [facility] <15 03/26/2024 0335     I have reviewed the images obtained:  CT without contrast 03/26/2024: No acute abnormality  MRI  brain without contrast 03/26/2024: Multiple scattered acute infarcts. Largest confluent focus of infarct in the mesial right temporal lobe measures up to 1.1 x 0.8 cm and involves the entorhinal cortex and the parahippocampal gyrus. Additional 0.8 cm focus of acute infarct involving the left frontal corona radiata and a portion of the caudate nucleus. Smaller areas of acute infarct in the left lentiform nucleus. Multiple areas of acute infarct in the right basal ganglia. Small focus of infarct in the right temporoparietal periventricular white matter. There are a few small foci of acute infarct in the expected location of the right optic tracts.  MRA head without contrast 03/26/2024: Possible short segment occlusion versus severe stenosis of the P2 segment of the right PCA, with evaluation limited by artifact.  Intracranial arterial vasculature is otherwise patent. Mild chronic microvascular ischemic changes.  ASSESSMENT/PLAN: 54 year old male with uncontrolled diabetes and hypertension who presents with hyperglycemia.  While in the hospital was noted to be altered and MRI brain showed multiple acute strokes.  Acute ischemic stroke Etiology: Likely embolic   Recommendations: - Carotid ultrasound ordered and pending - TTE ordered and pending.  If negative for thrombus, recommend cardiac monitor to look for paroxysmal A-fib - Recommend aspirin  81 mg daily and Plavix 75 mg daily for 3 months followed by aspirin  81  mg daily - Start atorvastatin  40 mg daily -Goal blood pressure: Gradual normotension - Management of comorbidities per primary team For stroke education - PT/OT - Follow-up with neurology 4 to 8 weeks (order placed) -discussed plan with patient bedside Discussed plan with Dr. Pearlean via secure chat   Thank you for allowing us  to participate in the care of this patient. If you have any further questions, please contact  me or neurohospitalist.   Arlin Krebs [facility] neurohospitalist

## 2024-03-27 NOTE — Progress Notes (Signed)
 " PROGRESS NOTE  Jordan Osborn, is a 54 y.o. male, DOB - 1970-03-17, FMW:[id number]  Admit date - 03/25/2024   Admitting Physician Posey Maier, DO  Outpatient Primary MD for the patient is Doroteo Deward BIRCH, FNP  LOS - 1  Chief Complaint  Patient presents with   Hyperglycemia      Brief Narrative:  55 y.o. male with medical history significant of obesity, diabetes mellitus who was admitted on 03/25/2024 with uncontrolled DM with metabolic acidosis and elevated anion gap required IV insulin  therapy initially - On 03/27/2023 patient had episode of unresponsiveness rapid response was called and CT head was unremarkable    -Assessment and Plan: 1) acute strokes --- MRI brain from 03/26/2021 shows  -Scattered acute infarcts involving multiple vascular territories,  -CT head unremarkable today =-Carotid artery Dopplers without hemodynamically significant stenosis -MRA head shows Possible short segment occlusion versus severe stenosis of the P2 segment of the right PCA, with evaluation limited by artifact, Intracranial arterial vasculature is otherwise patent. -Echo with EF of 55 to 60%, mild LVH, No MS, No AS,  - Recommend Aspirin  81 mg daily along with Plavix  75 mg daily for 90 days then after that STOP the Plavix   and continue ONLY Aspirin  81 mg daily indefinitely--for secondary stroke Prevention (Per The multicenter SAMMPRIS trial) -Continue atorvastatin  -- PT eval appreciated recommends SNF rehab - Patient will need 30-day event monitor at discharge  2)Acute metabolic Encephalopathy= most likely due to #1 above - Mentation returning to baseline --- EEG with mild diffuse encephalopathy without epileptiform findings -Serum ammonia is not elevated, -Neurology consult appreciated  3)HTN--BP has been soft - Due to concerns about possible acute stroke hold amlodipine , Coreg  and lisinopril  at this time  may use IV labetalol  when necessary  Every 4 hours for systolic blood pressure over 180  mmhg  4)Social/Ethics--- Attempted to reach relatives [employer] [phone]), and Annabella [phone] --Left voicemails for family members to return call -- 5) mild LFT elevation--right upper quadrant ultrasound consistent with status post prior cholecystectomy without intra or extrahepatic biliary duct dilatation  -- LFTs trending down    Latest Ref Rng & Units 03/27/2024    4:30 AM 03/26/2024    3:35 AM 03/25/2024    5:28 PM  Hepatic Function  Total Protein 6.5 - 8.1 g/dL 5.8  6.2  7.8   Albumin 3.5 - 5.0 g/dL 3.4  3.6  4.3   AST 15 - 41 U/L 59  85  97   ALT 0 - 44 U/L 36  47  55   Alk Phosphatase 38 - 126 U/L 133  158  209   Total Bilirubin 0.0 - 1.2 mg/dL 0.5  0.5  0.6     6)GERD--- continue Protonix   7)DM2--A1c 13.2 consistent with uncontrolled diabetes with hyperglycemia -Bicarb and anion gap has normalized - Off IV insulin  at this time  --continue subcutaneous insulin  Use Novolog /Humalog Sliding scale insulin  with Accu-Cheks/Fingersticks as ordered   Status is: Inpatient   Disposition: The patient is from: Home              Anticipated d/c is to: SNF              Anticipated d/c date is: 1 day              Patient currently is medically stable to d/c. Barriers:-Awaiting SNF placement for rehab-   Code Status :  -  Code Status: Full Code   Family Communication:   Unable  to reach family at this time -Attempted to reach relatives Quillian Brinks -[phone]), and Annabella [phone] --Left voicemails for family members to return call  DVT Prophylaxis  :   - SCDs  enoxaparin  (LOVENOX ) injection 40 mg Start: 03/26/24 1000 SCDs Start: 03/25/24 2229   Lab Results  Component Value Date   PLT 239 03/26/2024    Inpatient Medications  Scheduled Meds:  aspirin  EC  81 mg Oral Q breakfast   atorvastatin   40 mg Oral Daily   clopidogrel   75 mg Oral Daily   enoxaparin  (LOVENOX ) injection  40 mg Subcutaneous Q24H   insulin  aspart  0-5 Units  Subcutaneous QHS   insulin  aspart  0-9 Units Subcutaneous TID WC   insulin  glargine-yfgn  10 Units Subcutaneous QHS   pantoprazole   40 mg Oral Daily   Continuous Infusions:  lactated ringers  125 mL/hr (03/26/24 2132)   PRN Meds:.acetaminophen  **OR** acetaminophen , dextrose , labetalol , ondansetron  **OR** ondansetron  (ZOFRAN ) IV   Anti-infectives (From admission, onward)    None       Subjective: Jordan Osborn today has no fevers, no emesis,  ,   - -More awake, more coherent -- Participated in teleneurology interview and exam - Objective: Vitals:   03/26/24 1942 03/26/24 2210 03/27/24 0616 03/27/24 1254  BP: 118/79 (!) 151/86 134/73 128/77  Pulse: 91 89 94 89  Resp: 18 17 18 17   Temp: 98.7 F (37.1 C) 97.6 F (36.4 C) 97.6 F (36.4 C) 98.3 F (36.8 C)  TempSrc: Axillary Oral Oral   SpO2: 97% 97% 95% 96%  Weight:      Height:        Intake/Output Summary (Last 24 hours) at 03/27/2024 1850 Last data filed at 03/27/2024 0800 Gross per 24 hour  Intake 300 ml  Output --  Net 300 ml   Filed Weights   03/25/24 1657 03/26/24 0050  Weight: 91.2 kg 90.6 kg    Physical Exam Gen:-More awake, alert, no acute distress HEENT:- [place].AT, No sclera icterus Neck-Supple Neck,No JVD,.  Lungs-  CTAB , fair symmetrical air movement CV- S1, S2 normal, regular  Abd-  +ve B.Sounds, Abd Soft, No tenderness,    Extremity/Skin:- No  edema, pedal pulses present  Psych-affect is flat, oriented x3  Neuro-more awake, no significant focal motor deficits patient does have some sensory loss concerns but he also has peripheral neuropathy  Data Reviewed: I have personally reviewed following labs and imaging studies  CBC: Recent Labs  Lab 03/21/24 1931 03/22/24 1440 03/25/24 1728 03/26/24 0335  WBC 6.0 6.3 9.1 9.8  NEUTROABS 4.3 4.6 7.2  --   HGB 15.7 14.4 17.4* 14.5  HCT 46.4 42.6 51.9 42.3  MCV 86.7 88.0 88.3 86.7  PLT 219 218 260 239   Basic Metabolic Panel: Recent Labs  Lab  03/21/24 1931 03/22/24 1440 03/25/24 1728 03/26/24 0335 03/27/24 0430  NA 136 134* 133* 134* 134*  K 3.4* 4.1 4.0 3.6 3.5  CL 95* 93* 90* 95* 98  CO2 27 23 20* 24 28  GLUCOSE 385* 660* 436* 330* 336*  BUN 10 14 20  21* 18  CREATININE 0.95 1.14 1.20 1.23 1.06  CALCIUM  9.9 9.6 9.5 8.7* 8.8*  MG  --   --   --  1.8  --   PHOS  --   --   --  2.9  --    GFR: Estimated Creatinine Clearance: 86.4 mL/min (by C-G formula based on SCr of 1.06 mg/dL). Liver Function Tests: Recent Labs  Lab 03/21/24  1931 03/25/24 1728 03/26/24 0335 03/27/24 0430  AST 19 97* 85* 59*  ALT 18 55* 47* 36  ALKPHOS 188* 209* 158* 133*  BILITOT 0.5 0.6 0.5 0.5  PROT 7.4 7.8 6.2* 5.8*  ALBUMIN 4.3 4.3 3.6 3.4*   HbA1C: Recent Labs    03/25/24 1728  HGBA1C 13.2*   Radiology Studies: US  Carotid Bilateral Result Date: 03/27/2024 EXAM: US  CAROTID DUPLEX 03/27/2024 10:10:46 AM TECHNIQUE: Real-time grayscale, color flow and spectral Doppler sonographic images were obtained of the extracranial carotid system using a linear transducer. COMPARISON: None available CLINICAL HISTORY: Syncope and collapse. Hypertension, syncope, diabetes. FINDINGS: RIGHT: Common carotid artery: 93 cm/s Internal carotid artery: 96 cm/s External carotid artery: 131 cm/s Right ICA/CCA ratio: Plaque: mild plaque or wall thickening through the right common carotid artery. Partially calcified plaque in the carotid bulb proximal right ICA without high grade stenosis. Vertebral artery: Antegrade LEFT: Common carotid artery: 134 cm/s Internal carotid artery: 95 cm/s External carotid artery: 98 cm/s Left ICA/CCA ratio: 0.7 Plaque: Mild thickening throughout the left common carotid artery. Partially calcified plaque in the left carotid bulb and proximal ICA without high grade stenosis. Vertebral artery: Antegrade IMPRESSION: 1. No hemodynamically significant (greater than 50%) stenosis of the extracranial internal carotid arteries. 2. Partially calcified  atherosclerotic plaque at the carotid bulbs and proximal internal carotid arteries bilaterally without hemodynamically significant stenosis. Electronically signed by: Katheleen Faes MD 03/27/2024 01:08 PM EST RP Workstation: [id number]   ECHOCARDIOGRAM COMPLETE Result Date: 03/27/2024    ECHOCARDIOGRAM REPORT   Patient Name:   Jordan Osborn Date of Exam: 03/27/2024 Medical Rec #:  [id number]  Height:       72.0 in Accession #:    [id number] Weight:       199.7 lb Date of Birth:  Jul 11, 1970   BSA:          2.129 m Patient Age:    53 years   BP:           134/74 mmHg Patient Gender: M          HR:           85 bpm. Exam Location:  [facility] Procedure: 2D Echo, Color Doppler and Cardiac Doppler (Both Spectral and Color            Flow Doppler were utilized during procedure). Indications:    Stroke I63.9  History:        Patient has no prior history of Echocardiogram examinations.                 Risk Factors:Diabetes and Hypertension.  Sonographer:    Sydnee Wilson RDCS Referring Phys: [id number] Marcquis Ridlon IMPRESSIONS  1. Left ventricular ejection fraction, by estimation, is 55 to 60%. The left ventricle has normal function. The left ventricle has no regional wall motion abnormalities. There is mild left ventricular hypertrophy. Left ventricular diastolic parameters are indeterminate.  2. Right ventricular systolic function is normal. The right ventricular size is normal.  3. The mitral valve is normal in structure. No evidence of mitral valve regurgitation. No evidence of mitral stenosis.  4. The aortic valve is tricuspid. Aortic valve regurgitation is not visualized. No aortic stenosis is present.  5. The inferior vena cava is normal in size with greater than 50% respiratory variability, suggesting right atrial pressure of 3 mmHg. FINDINGS  Left Ventricle: Left ventricular ejection fraction, by estimation, is 55 to 60%. The left ventricle has normal function. The left ventricle has no  regional wall motion  abnormalities. The left ventricular internal cavity size was normal in size. There is  mild left ventricular hypertrophy. Left ventricular diastolic parameters are indeterminate. Right Ventricle: The right ventricular size is normal. Right vetricular wall thickness was not well visualized. Right ventricular systolic function is normal. Left Atrium: Left atrial size was normal in size. Right Atrium: Right atrial size was normal in size. Pericardium: There is no evidence of pericardial effusion. Mitral Valve: The mitral valve is normal in structure. No evidence of mitral valve regurgitation. No evidence of mitral valve stenosis. Tricuspid Valve: The tricuspid valve is normal in structure. Tricuspid valve regurgitation is not demonstrated. No evidence of tricuspid stenosis. Aortic Valve: The aortic valve is tricuspid. Aortic valve regurgitation is not visualized. No aortic stenosis is present. Aortic valve mean gradient measures 4.6 mmHg. Aortic valve peak gradient measures 9.9 mmHg. Aortic valve area, by VTI measures 2.70 cm. Pulmonic Valve: The pulmonic valve was not well visualized. Pulmonic valve regurgitation is not visualized. No evidence of pulmonic stenosis. Aorta: The aortic root and ascending aorta are structurally normal, with no evidence of dilitation. Venous: The inferior vena cava is normal in size with greater than 50% respiratory variability, suggesting right atrial pressure of 3 mmHg. IAS/Shunts: No atrial level shunt detected by color flow Doppler.  LEFT VENTRICLE PLAX 2D LVIDd:         4.20 cm      Diastology LVIDs:         3.00 cm      LV e' medial:    8.49 cm/s LV PW:         1.10 cm      LV E/e' medial:  8.8 LV IVS:        1.10 cm      LV e' lateral:   8.70 cm/s LVOT diam:     2.20 cm      LV E/e' lateral: 8.6 LV SV:         69 LV SV Index:   32 LVOT Area:     3.80 cm  LV Volumes (MOD) LV vol d, MOD A2C: 159.0 ml LV vol d, MOD A4C: 120.0 ml LV vol s, MOD A2C: 53.2 ml LV vol s, MOD A4C: 48.5 ml  LV SV MOD A2C:     105.8 ml LV SV MOD A4C:     120.0 ml LV SV MOD BP:      95.2 ml RIGHT VENTRICLE RV S prime:     17.50 cm/s TAPSE (M-mode): 1.8 cm LEFT ATRIUM             Index        RIGHT ATRIUM           Index LA diam:        3.20 cm 1.50 cm/m   RA Area:     11.10 cm LA Vol (A2C):   64.2 ml 30.15 ml/m  RA Volume:   20.40 ml  9.58 ml/m LA Vol (A4C):   42.5 ml 19.96 ml/m LA Biplane Vol: 55.1 ml 25.88 ml/m  AORTIC VALVE AV Area (Vmax):    2.49 cm AV Area (Vmean):   2.49 cm AV Area (VTI):     2.70 cm AV Vmax:           157.21 cm/s AV Vmean:          98.304 cm/s AV VTI:            0.254 m AV Peak  Grad:      9.9 mmHg AV Mean Grad:      4.6 mmHg LVOT Vmax:         103.00 cm/s LVOT Vmean:        64.300 cm/s LVOT VTI:          0.181 m LVOT/AV VTI ratio: 0.71  AORTA Ao Root diam: 3.20 cm Ao Asc diam:  3.40 cm MITRAL VALVE MV Area (PHT): 3.16 cm    SHUNTS MV Decel Time: 240 msec    Systemic VTI:  0.18 m MV E velocity: 74.40 cm/s  Systemic Diam: 2.20 cm MV A velocity: 82.80 cm/s MV E/A ratio:  0.90 Dorn Ross MD Electronically signed by Dorn Ross MD Signature Date/Time: 03/27/2024/11:02:39 AM    Final    MR ANGIO HEAD WO CONTRAST Result Date: 03/26/2024 EXAM: MRI BRAIN WITHOUT CONTRAST MRA HEAD WITHOUT CONTRAST 03/26/2024 06:13:20 PM TECHNIQUE: Multiplanar multisequence MRI of the brain was performed without the administration of intravenous contrast. MRA of the head was performed without contrast using time-of-flight technique. 3D postprocessing with multiplanar reformations and MIPs was performed for better evaluation of the vasculature. COMPARISON: CT head dated 03/26/2024. CLINICAL HISTORY: Mental status change, unknown cause. FINDINGS: MRI BRAIN: BRAIN AND VENTRICLES: Multiple scattered acute infarcts. Largest confluent focus of infarct in the mesial right temporal lobe measures up to 1.1 x 0.8 cm and involves the entorhinal cortex and the parahippocampal gyrus. Additional 0.8 cm focus of acute  infarct involving the left frontal corona radiata and a portion of the caudate nucleus. Smaller areas of acute infarct in the left lentiform nucleus. Multiple areas of acute infarct in the right basal ganglia. Small focus of infarct in the right temporoparietal periventricular white matter. There are a few small foci of acute infarct in the expected location of the right optic tracts. Scattered areas of T2 and FLAIR hyperintensity in the periventricular and subcortical white matter compatible with mild chronic microvascular ischemic changes. No acute intracranial hemorrhage. No mass. No midline shift. No hydrocephalus. Normal flow voids. ORBITS: No significant abnormality. SINUSES AND MASTOIDS: Left mastoid effusion. BONES AND SOFT TISSUES: Normal bone marrow signal. Degenerative changes in the visualized upper cervical spine. There is at least mild spinal canal stenosis at the C3-C4 level. MRA HEAD: ANTERIOR CIRCULATION: The intracranial internal carotid arteries are patent bilaterally. The middle cerebral arteries are patent bilaterally. Visualized portions of the anterior cerebral arteries are patent bilaterally. No aneurysm. POSTERIOR CIRCULATION: The distal V4 segments are patent bilaterally. The left vertebral artery appears to terminate at the origin of the left PICA. Basilar artery is patent. The superior cerebellar arteries are patent bilaterally. The P1 segments of the bilateral PCAs are patent. The P2 segment of the left PCA is patent with mild irregularity. Significantly limited evaluation of the P2 segment of the right PCA likely due to in-plane flow artifact as well as motion artifact. There is possible short segment occlusion versus severe stenosis of the right P2 segment. No aneurysm. IMPRESSION: 1. 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 2. Possible short segment occlusion versus severe stenosis of the P2 segment of the right PCA, with evaluation limited by  artifact. 3. Intracranial arterial vasculature is otherwise patent. 4. Mild chronic microvascular ischemic changes. 5. Left mastoid effusion. Electronically signed by: Donnice Mania MD 03/26/2024 11:13 PM EST RP Workstation: [id number]   MR BRAIN WO CONTRAST Result Date: 03/26/2024 EXAM: MRI BRAIN WITHOUT CONTRAST MRA HEAD WITHOUT CONTRAST 03/26/2024 06:13:20 PM TECHNIQUE: Multiplanar multisequence MRI  of the brain was performed without the administration of intravenous contrast. MRA of the head was performed without contrast using time-of-flight technique. 3D postprocessing with multiplanar reformations and MIPs was performed for better evaluation of the vasculature. COMPARISON: CT head dated 03/26/2024. CLINICAL HISTORY: Mental status change, unknown cause. FINDINGS: MRI BRAIN: BRAIN AND VENTRICLES: Multiple scattered acute infarcts. Largest confluent focus of infarct in the mesial right temporal lobe measures up to 1.1 x 0.8 cm and involves the entorhinal cortex and the parahippocampal gyrus. Additional 0.8 cm focus of acute infarct involving the left frontal corona radiata and a portion of the caudate nucleus. Smaller areas of acute infarct in the left lentiform nucleus. Multiple areas of acute infarct in the right basal ganglia. Small focus of infarct in the right temporoparietal periventricular white matter. There are a few small foci of acute infarct in the expected location of the right optic tracts. Scattered areas of T2 and FLAIR hyperintensity in the periventricular and subcortical white matter compatible with mild chronic microvascular ischemic changes. No acute intracranial hemorrhage. No mass. No midline shift. No hydrocephalus. Normal flow voids. ORBITS: No significant abnormality. SINUSES AND MASTOIDS: Left mastoid effusion. BONES AND SOFT TISSUES: Normal bone marrow signal. Degenerative changes in the visualized upper cervical spine. There is at least mild spinal canal stenosis at the C3-C4 level.  MRA HEAD: ANTERIOR CIRCULATION: The intracranial internal carotid arteries are patent bilaterally. The middle cerebral arteries are patent bilaterally. Visualized portions of the anterior cerebral arteries are patent bilaterally. No aneurysm. POSTERIOR CIRCULATION: The distal V4 segments are patent bilaterally. The left vertebral artery appears to terminate at the origin of the left PICA. Basilar artery is patent. The superior cerebellar arteries are patent bilaterally. The P1 segments of the bilateral PCAs are patent. The P2 segment of the left PCA is patent with mild irregularity. Significantly limited evaluation of the P2 segment of the right PCA likely due to in-plane flow artifact as well as motion artifact. There is possible short segment occlusion versus severe stenosis of the right P2 segment. No aneurysm. IMPRESSION: 1. 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 2. Possible short segment occlusion versus severe stenosis of the P2 segment of the right PCA, with evaluation limited by artifact. 3. Intracranial arterial vasculature is otherwise patent. 4. Mild chronic microvascular ischemic changes. 5. Left mastoid effusion. Electronically signed by: Donnice Mania MD 03/26/2024 11:13 PM EST RP Workstation: [id number]   EEG adult Result Date: 03/26/2024 Shelton Arlin KIDD, MD     03/26/2024  3:47 PM Patient Name: Jordan Osborn MRN: [id number] Epilepsy Attending: Arlin KIDD Shelton Referring Physician/Provider: Pearlean Manus, MD Date: 03/26/2024 Duration: 23.49 mins Patient history: 54yo M with ams. EEG to evaluate for seizure Level of alertness: Awake, asleep AEDs during EEG study: None Technical aspects: This EEG study was done with scalp electrodes positioned according to the 10-20 International system of electrode placement. Electrical activity was reviewed with band pass filter of 1-70Hz , sensitivity of 7 uV/mm, display speed of 7mm/sec with a 60Hz  notched filter applied as  appropriate. EEG data were recorded continuously and digitally stored.  Video monitoring was available and reviewed as appropriate. Description: The posterior dominant rhythm consists of 6 Hz activity of moderate voltage (25-35 uV) seen predominantly in posterior head regions, symmetric and reactive to eye opening and eye closing. Sleep was characterized by vertex waves, sleep spindles (12 to 14 Hz), maximal frontocentral region. There is continuous generalized 5 to 6 Hz theta slowing. Hyperventilation and photic stimulation  were not performed.   ABNORMALITY - Continuous slow, generalized IMPRESSION: This study is suggestive of mild diffuse encephalopathy. No seizures or epileptiform discharges were seen throughout the recording. Priyanka MALVA Krebs   US  Abdomen Limited RUQ (LIVER/GB) Result Date: 03/26/2024 EXAM: Right Upper Quadrant Abdominal Ultrasound 03/26/2024 11:11:50 AM TECHNIQUE: Real-time ultrasonography of the right upper quadrant of the abdomen was performed. COMPARISON: 03/25/2024 CLINICAL HISTORY: Abnormal LFTs (liver function tests). FINDINGS: LIVER: Normal echogenicity. No intrahepatic biliary ductal dilatation. No evidence of mass. Hepatopetal flow in the portal vein. BILIARY SYSTEM: Cholecystectomy. No intrahepatic or extrahepatic biliary ductal dilation. Common bile duct is within normal limits measuring 4.4 mm. OTHER: No right upper quadrant ascites. IMPRESSION: 1. Cholecystectomy. No intrahepatic or extrahepatic biliary ductal dilation. Electronically signed by: Rogelia Myers MD 03/26/2024 03:09 PM EST RP Workstation: [id number]   CT HEAD WO CONTRAST ( ) Result Date: 03/26/2024 EXAM: CT HEAD WITHOUT CONTRAST 03/26/2024 09:29:56 AM TECHNIQUE: CT of the head was performed without the administration of intravenous contrast. Automated exposure control, iterative reconstruction, and/or weight based adjustment of the mA/kV was utilized to reduce the radiation dose to as low as reasonably  achievable. COMPARISON: Head CT 10/20/2021. CLINICAL HISTORY: 54 year old male. Mental status change, unknown cause. Hospitalized with hyperglycemia. FINDINGS: BRAIN AND VENTRICLES: No acute hemorrhage. No evidence of acute infarct. 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 Punctate dystrophic calcification of the left ventral pons is chronic. Calcified atherosclerosis at the skull base. Background gray white differentiation is stable and largely normal. Probable small chronic dilated perivascular spaces in some of the superior frontal lobes subcortical white matter. No suspicious intracranial vascular hyperdensity. No hydrocephalus. No extra-axial collection. No mass effect or midline shift. ORBITS: Rightward gaze deviation. SINUSES: No acute abnormality. SOFT TISSUES AND SKULL: Trace left mastoid effusion, chronic. No acute soft tissue abnormality. No skull fracture. IMPRESSION: 1. No acute intracranial abnormality. 2. 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 Electronically signed by: Helayne Hurst MD 03/26/2024 09:39 AM EST RP Workstation: [id number]   CT ABDOMEN PELVIS WO CONTRAST Result Date: 03/25/2024 CLINICAL DATA:  Status post fall. EXAM: CT ABDOMEN AND PELVIS WITHOUT CONTRAST TECHNIQUE: Multidetector CT imaging of the abdomen and pelvis was performed following the standard protocol without IV contrast. RADIATION DOSE REDUCTION: This exam was performed according to the departmental dose-optimization program which includes automated exposure control, adjustment of the mA and/or kV according to patient size and/or use of iterative reconstruction technique. COMPARISON:  October 25, 2022 FINDINGS: Lower chest: No acute abnormality. Hepatobiliary: No focal liver abnormality is seen. Status post cholecystectomy. No biliary dilatation. Pancreas: Unremarkable. No pancreatic ductal dilatation or surrounding  inflammatory changes. Spleen: Normal in size without focal abnormality. Adrenals/Urinary Tract: Adrenal glands are unremarkable. Kidneys are normal in size, without renal calculi, focal lesion, or hydronephrosis. There is mild, stable nonspecific bilateral perinephric inflammatory fat stranding. Bladder is unremarkable. Stomach/Bowel: Stomach is within normal limits. The appendix is not identified. Surgical clips are seen within the right lower quadrant. No evidence of bowel wall thickening, distention, or inflammatory changes. Vascular/Lymphatic: Aortic atherosclerosis. No enlarged abdominal or pelvic lymph nodes. Reproductive: Prostate is unremarkable. Other: No abdominal wall hernia or abnormality. No abdominopelvic ascites. Musculoskeletal: No acute or significant osseous findings. IMPRESSION: 1. No acute or active process within the abdomen or pelvis. 2. Evidence of prior cholecystectomy. 3. Aortic atherosclerosis. Electronically Signed   By: Suzen Dials M.D.   On: 03/25/2024 19:01  Scheduled Meds:  aspirin  EC  81 mg Oral Q breakfast   atorvastatin   40 mg Oral Daily   clopidogrel   75 mg Oral Daily   enoxaparin  (LOVENOX ) injection  40 mg Subcutaneous Q24H   insulin  aspart  0-5 Units Subcutaneous QHS   insulin  aspart  0-9 Units Subcutaneous TID WC   insulin  glargine-yfgn  10 Units Subcutaneous QHS   pantoprazole   40 mg Oral Daily   Continuous Infusions:  lactated ringers  125 mL/hr (03/26/24 2132)    LOS: 1 day   Rendall Carwin M.D on 03/27/2024 at 6:50 PM  Go to [email or web address] - for contact info  [facility] - Office  [phone]  If 7PM-7AM, please contact night-coverage [email or web address] 03/27/2024, 6:50 PM    "

## 2024-03-28 ENCOUNTER — Inpatient Hospital Stay (HOSPITAL_COMMUNITY)

## 2024-03-28 DIAGNOSIS — E1165 Type 2 diabetes mellitus with hyperglycemia: Secondary | ICD-10-CM | POA: Diagnosis not present

## 2024-03-28 DIAGNOSIS — I639 Cerebral infarction, unspecified: Secondary | ICD-10-CM

## 2024-03-28 DIAGNOSIS — Z794 Long term (current) use of insulin: Secondary | ICD-10-CM | POA: Diagnosis not present

## 2024-03-28 DIAGNOSIS — K219 Gastro-esophageal reflux disease without esophagitis: Secondary | ICD-10-CM | POA: Diagnosis not present

## 2024-03-28 DIAGNOSIS — I10 Essential (primary) hypertension: Secondary | ICD-10-CM | POA: Diagnosis not present

## 2024-03-28 LAB — GLUCOSE, CAPILLARY
Glucose-Capillary: 274 mg/dL — ABNORMAL HIGH (ref 70–99)
Glucose-Capillary: 231 mg/dL — ABNORMAL HIGH (ref 70–99)
Glucose-Capillary: 307 mg/dL — ABNORMAL HIGH (ref 70–99)
Glucose-Capillary: 244 mg/dL — ABNORMAL HIGH (ref 70–99)

## 2024-03-28 MED ORDER — CARVEDILOL 3.125 MG PO TABS
6.2500 mg | ORAL_TABLET | Freq: Two times a day (BID) | ORAL | Status: DC
  Administered 2024-03-29 – 2024-03-31 (×5): 6.25 mg via ORAL
  Filled 2024-03-28 (×5): qty 2

## 2024-03-28 NOTE — Plan of Care (Signed)

## 2024-03-28 NOTE — Progress Notes (Signed)
 " PROGRESS NOTE  Jordan Osborn, is a 54 y.o. male, DOB - 1971-01-01, FMW:[id number]  Admit date - 03/25/2024   Admitting Physician Posey Maier, DO  Outpatient Primary MD for the patient is Doroteo Deward BIRCH, FNP  LOS - 2  Chief Complaint  Patient presents with   Hyperglycemia      Brief Narrative:  54 y.o. male with medical history significant of obesity, diabetes mellitus who was admitted on 03/25/2024 with uncontrolled DM with metabolic acidosis and elevated anion gap required IV insulin  therapy initially - On 03/27/2023 patient had episode of unresponsiveness rapid response was called and CT head was unremarkable    -Assessment and Plan: 1) acute strokes --- MRI brain from 03/26/2024 shows  -Scattered acute infarcts involving multiple vascular territories,  -CT head unremarkable today =-Carotid artery Dopplers without hemodynamically significant stenosis -MRA head shows Possible short segment occlusion versus severe stenosis of the P2 segment of the right PCA, with evaluation limited by artifact, Intracranial arterial vasculature is otherwise patent. -Echo with EF of 55 to 60%, mild LVH, No MS, No AS,  - Recommend Aspirin  81 mg daily along with Plavix  75 mg daily for 90 days then after that STOP the Plavix   and continue ONLY Aspirin  81 mg daily indefinitely--for secondary stroke Prevention (Per The multicenter SAMMPRIS trial) -Continue atorvastatin  -- PT eval appreciated recommends SNF rehab - Patient will need 30-day heart monitor to rule out A-fib at discharge  2)Acute metabolic Encephalopathy= most likely due to #1 above --- EEG with mild diffuse encephalopathy without epileptiform findings -Serum ammonia is not elevated, -Neurology consult appreciated -- Mentation is back to baseline  3)HTN--BP now elevated -Okay to restart Coreg  -Continue to hold amlodipine  and lisinopril   may use IV labetalol  when necessary  Every 4 hours for systolic blood pressure over 180  mmhg  4)Social/Ethics--- Attempted to reach relatives [employer] -[phone]), and Annabella [phone] --Left voicemails for family members to return call -- 5)Mild LFT Elevation--right upper quadrant ultrasound consistent with status post prior cholecystectomy without intra or extrahepatic biliary duct dilatation  -- LFTs trending down    Latest Ref Rng & Units 03/27/2024    4:30 AM 03/26/2024    3:35 AM 03/25/2024    5:28 PM  Hepatic Function  Total Protein 6.5 - 8.1 g/dL 5.8  6.2  7.8   Albumin 3.5 - 5.0 g/dL 3.4  3.6  4.3   AST 15 - 41 U/L 59  85  97   ALT 0 - 44 U/L 36  47  55   Alk Phosphatase 38 - 126 U/L 133  158  209   Total Bilirubin 0.0 - 1.2 mg/dL 0.5  0.5  0.6    6)GERD--- continue Protonix   7)DM2--A1c 13.2 consistent with uncontrolled diabetes with hyperglycemia -Bicarb and anion gap has normalized - Off IV insulin  at this time  --continue subcutaneous insulin  Use Novolog /Humalog Sliding scale insulin  with Accu-Cheks/Fingersticks as ordered   Status is: Inpatient   Disposition: The patient is from: Home              Anticipated d/c is to: SNF              Anticipated d/c date is: 1 day              Patient currently is medically stable to d/c. Barriers:-Awaiting SNF placement for rehab-   Code Status :  -  Code Status: Full Code   Family Communication:   Unable to reach family at  this time -Attempted to reach relatives [facility] [phone]), and Annabella [phone] --Left voicemails for family members to return call  DVT Prophylaxis  :   - SCDs  enoxaparin  (LOVENOX ) injection 40 mg Start: 03/26/24 1000 SCDs Start: 03/25/24 2229   Lab Results  Component Value Date   PLT 239 03/26/2024   Inpatient Medications  Scheduled Meds:  aspirin  EC  81 mg Oral Q breakfast   atorvastatin   40 mg Oral Daily   clopidogrel   75 mg Oral Daily   enoxaparin  (LOVENOX ) injection  40 mg Subcutaneous Q24H   insulin  aspart  0-5 Units  Subcutaneous QHS   insulin  aspart  0-9 Units Subcutaneous TID WC   insulin  glargine-yfgn  10 Units Subcutaneous QHS   pantoprazole   40 mg Oral Daily   Continuous Infusions:  PRN Meds:.acetaminophen  **OR** acetaminophen , dextrose , labetalol , ondansetron  **OR** ondansetron  (ZOFRAN ) IV   Anti-infectives (From admission, onward)    None       Subjective: Jordan Osborn today has no fevers, no emesis,  ,   - -More awake, more coherent -He is cooperative -Eating and drinking okay - Objective: Vitals:   03/27/24 1254 03/27/24 1945 03/28/24 0506 03/28/24 1316  BP: 128/77 (!) 149/90 (!) 178/93 (!) 166/96  Pulse: 89 89 84 83  Resp: 17 18  19   Temp: 98.3 F (36.8 C) 98.3 F (36.8 C) 99.3 F (37.4 C) 98 F (36.7 C)  TempSrc:  Oral Oral Oral  SpO2: 96% 92% 96% 96%  Weight:      Height:        Intake/Output Summary (Last 24 hours) at 03/28/2024 1806 Last data filed at 03/28/2024 1000 Gross per 24 hour  Intake 1020 ml  Output 1300 ml  Net -280 ml   Filed Weights   03/25/24 1657 03/26/24 0050  Weight: 91.2 kg 90.6 kg    Physical Exam Gen:-More awake, alert, no acute distress HEENT:- [place].AT, No sclera icterus Neck-Supple Neck,No JVD,.  Lungs-  CTAB , fair symmetrical air movement CV- S1, S2 normal, regular  Abd-  +ve B.Sounds, Abd Soft, No tenderness,    Extremity/Skin:- pedal pulses present  Psych-affect is flat, oriented x3  Neuro-more awake, no significant focal motor deficits patient does have some sensory loss concerns but he also has peripheral neuropathy MSK--bilateral foot and ankle swelling and tenderness  Data Reviewed: I have personally reviewed following labs and imaging studies  CBC: Recent Labs  Lab 03/21/24 1931 03/22/24 1440 03/25/24 1728 03/26/24 0335  WBC 6.0 6.3 9.1 9.8  NEUTROABS 4.3 4.6 7.2  --   HGB 15.7 14.4 17.4* 14.5  HCT 46.4 42.6 51.9 42.3  MCV 86.7 88.0 88.3 86.7  PLT 219 218 260 239   Basic Metabolic Panel: Recent Labs  Lab  03/21/24 1931 03/22/24 1440 03/25/24 1728 03/26/24 0335 03/27/24 0430  NA 136 134* 133* 134* 134*  K 3.4* 4.1 4.0 3.6 3.5  CL 95* 93* 90* 95* 98  CO2 27 23 20* 24 28  GLUCOSE 385* 660* 436* 330* 336*  BUN 10 14 20  21* 18  CREATININE 0.95 1.14 1.20 1.23 1.06  CALCIUM  9.9 9.6 9.5 8.7* 8.8*  MG  --   --   --  1.8  --   PHOS  --   --   --  2.9  --    GFR: Estimated Creatinine Clearance: 86.4 mL/min (by C-G formula based on SCr of 1.06 mg/dL). Liver Function Tests: Recent Labs  Lab 03/21/24 1931 03/25/24 1728 03/26/24 0335 03/27/24  0430  AST 19 97* 85* 59*  ALT 18 55* 47* 36  ALKPHOS 188* 209* 158* 133*  BILITOT 0.5 0.6 0.5 0.5  PROT 7.4 7.8 6.2* 5.8*  ALBUMIN 4.3 4.3 3.6 3.4*   Radiology Studies: US  Carotid Bilateral Result Date: 03/27/2024 EXAM: US  CAROTID DUPLEX 03/27/2024 10:10:46 AM TECHNIQUE: Real-time grayscale, color flow and spectral Doppler sonographic images were obtained of the extracranial carotid system using a linear transducer. COMPARISON: None available CLINICAL HISTORY: Syncope and collapse. Hypertension, syncope, diabetes. FINDINGS: RIGHT: Common carotid artery: 93 cm/s Internal carotid artery: 96 cm/s External carotid artery: 131 cm/s Right ICA/CCA ratio: Plaque: mild plaque or wall thickening through the right common carotid artery. Partially calcified plaque in the carotid bulb proximal right ICA without high grade stenosis. Vertebral artery: Antegrade LEFT: Common carotid artery: 134 cm/s Internal carotid artery: 95 cm/s External carotid artery: 98 cm/s Left ICA/CCA ratio: 0.7 Plaque: Mild thickening throughout the left common carotid artery. Partially calcified plaque in the left carotid bulb and proximal ICA without high grade stenosis. Vertebral artery: Antegrade IMPRESSION: 1. No hemodynamically significant (greater than 50%) stenosis of the extracranial internal carotid arteries. 2. Partially calcified atherosclerotic plaque at the carotid bulbs and proximal  internal carotid arteries bilaterally without hemodynamically significant stenosis. Electronically signed by: Katheleen Faes MD 03/27/2024 01:08 PM EST RP Workstation: [id number]   ECHOCARDIOGRAM COMPLETE Result Date: 03/27/2024    ECHOCARDIOGRAM REPORT   Patient Name:   Jordan Osborn Date of Exam: 03/27/2024 Medical Rec #:  [id number]  Height:       72.0 in Accession #:    [id number] Weight:       199.7 lb Date of Birth:  January 15, 1971   BSA:          2.129 m Patient Age:    53 years   BP:           134/74 mmHg Patient Gender: M          HR:           85 bpm. Exam Location:  [facility] Procedure: 2D Echo, Color Doppler and Cardiac Doppler (Both Spectral and Color            Flow Doppler were utilized during procedure). Indications:    Stroke I63.9  History:        Patient has no prior history of Echocardiogram examinations.                 Risk Factors:Diabetes and Hypertension.  Sonographer:    Sydnee Wilson RDCS Referring Phys: [id number] Mona Ayars IMPRESSIONS  1. Left ventricular ejection fraction, by estimation, is 55 to 60%. The left ventricle has normal function. The left ventricle has no regional wall motion abnormalities. There is mild left ventricular hypertrophy. Left ventricular diastolic parameters are indeterminate.  2. Right ventricular systolic function is normal. The right ventricular size is normal.  3. The mitral valve is normal in structure. No evidence of mitral valve regurgitation. No evidence of mitral stenosis.  4. The aortic valve is tricuspid. Aortic valve regurgitation is not visualized. No aortic stenosis is present.  5. The inferior vena cava is normal in size with greater than 50% respiratory variability, suggesting right atrial pressure of 3 mmHg. FINDINGS  Left Ventricle: Left ventricular ejection fraction, by estimation, is 55 to 60%. The left ventricle has normal function. The left ventricle has no regional wall motion abnormalities. The left ventricular internal cavity size was normal  in size. There is  mild  left ventricular hypertrophy. Left ventricular diastolic parameters are indeterminate. Right Ventricle: The right ventricular size is normal. Right vetricular wall thickness was not well visualized. Right ventricular systolic function is normal. Left Atrium: Left atrial size was normal in size. Right Atrium: Right atrial size was normal in size. Pericardium: There is no evidence of pericardial effusion. Mitral Valve: The mitral valve is normal in structure. No evidence of mitral valve regurgitation. No evidence of mitral valve stenosis. Tricuspid Valve: The tricuspid valve is normal in structure. Tricuspid valve regurgitation is not demonstrated. No evidence of tricuspid stenosis. Aortic Valve: The aortic valve is tricuspid. Aortic valve regurgitation is not visualized. No aortic stenosis is present. Aortic valve mean gradient measures 4.6 mmHg. Aortic valve peak gradient measures 9.9 mmHg. Aortic valve area, by VTI measures 2.70 cm. Pulmonic Valve: The pulmonic valve was not well visualized. Pulmonic valve regurgitation is not visualized. No evidence of pulmonic stenosis. Aorta: The aortic root and ascending aorta are structurally normal, with no evidence of dilitation. Venous: The inferior vena cava is normal in size with greater than 50% respiratory variability, suggesting right atrial pressure of 3 mmHg. IAS/Shunts: No atrial level shunt detected by color flow Doppler.  LEFT VENTRICLE PLAX 2D LVIDd:         4.20 cm      Diastology LVIDs:         3.00 cm      LV e' medial:    8.49 cm/s LV PW:         1.10 cm      LV E/e' medial:  8.8 LV IVS:        1.10 cm      LV e' lateral:   8.70 cm/s LVOT diam:     2.20 cm      LV E/e' lateral: 8.6 LV SV:         69 LV SV Index:   32 LVOT Area:     3.80 cm  LV Volumes (MOD) LV vol d, MOD A2C: 159.0 ml LV vol d, MOD A4C: 120.0 ml LV vol s, MOD A2C: 53.2 ml LV vol s, MOD A4C: 48.5 ml LV SV MOD A2C:     105.8 ml LV SV MOD A4C:     120.0 ml LV SV MOD BP:       95.2 ml RIGHT VENTRICLE RV S prime:     17.50 cm/s TAPSE (M-mode): 1.8 cm LEFT ATRIUM             Index        RIGHT ATRIUM           Index LA diam:        3.20 cm 1.50 cm/m   RA Area:     11.10 cm LA Vol (A2C):   64.2 ml 30.15 ml/m  RA Volume:   20.40 ml  9.58 ml/m LA Vol (A4C):   42.5 ml 19.96 ml/m LA Biplane Vol: 55.1 ml 25.88 ml/m  AORTIC VALVE AV Area (Vmax):    2.49 cm AV Area (Vmean):   2.49 cm AV Area (VTI):     2.70 cm AV Vmax:           157.21 cm/s AV Vmean:          98.304 cm/s AV VTI:            0.254 m AV Peak Grad:      9.9 mmHg AV Mean Grad:      4.6 mmHg LVOT  Vmax:         103.00 cm/s LVOT Vmean:        64.300 cm/s LVOT VTI:          0.181 m LVOT/AV VTI ratio: 0.71  AORTA Ao Root diam: 3.20 cm Ao Asc diam:  3.40 cm MITRAL VALVE MV Area (PHT): 3.16 cm    SHUNTS MV Decel Time: 240 msec    Systemic VTI:  0.18 m MV E velocity: 74.40 cm/s  Systemic Diam: 2.20 cm MV A velocity: 82.80 cm/s MV E/A ratio:  0.90 Dorn Ross MD Electronically signed by Dorn Ross MD Signature Date/Time: 03/27/2024/11:02:39 AM    Final    MR ANGIO HEAD WO CONTRAST Result Date: 03/26/2024 EXAM: MRI BRAIN WITHOUT CONTRAST MRA HEAD WITHOUT CONTRAST 03/26/2024 06:13:20 PM TECHNIQUE: Multiplanar multisequence MRI of the brain was performed without the administration of intravenous contrast. MRA of the head was performed without contrast using time-of-flight technique. 3D postprocessing with multiplanar reformations and MIPs was performed for better evaluation of the vasculature. COMPARISON: CT head dated 03/26/2024. CLINICAL HISTORY: Mental status change, unknown cause. FINDINGS: MRI BRAIN: BRAIN AND VENTRICLES: Multiple scattered acute infarcts. Largest confluent focus of infarct in the mesial right temporal lobe measures up to 1.1 x 0.8 cm and involves the entorhinal cortex and the parahippocampal gyrus. Additional 0.8 cm focus of acute infarct involving the left frontal corona radiata and a portion of the  caudate nucleus. Smaller areas of acute infarct in the left lentiform nucleus. Multiple areas of acute infarct in the right basal ganglia. Small focus of infarct in the right temporoparietal periventricular white matter. There are a few small foci of acute infarct in the expected location of the right optic tracts. Scattered areas of T2 and FLAIR hyperintensity in the periventricular and subcortical white matter compatible with mild chronic microvascular ischemic changes. No acute intracranial hemorrhage. No mass. No midline shift. No hydrocephalus. Normal flow voids. ORBITS: No significant abnormality. SINUSES AND MASTOIDS: Left mastoid effusion. BONES AND SOFT TISSUES: Normal bone marrow signal. Degenerative changes in the visualized upper cervical spine. There is at least mild spinal canal stenosis at the C3-C4 level. MRA HEAD: ANTERIOR CIRCULATION: The intracranial internal carotid arteries are patent bilaterally. The middle cerebral arteries are patent bilaterally. Visualized portions of the anterior cerebral arteries are patent bilaterally. No aneurysm. POSTERIOR CIRCULATION: The distal V4 segments are patent bilaterally. The left vertebral artery appears to terminate at the origin of the left PICA. Basilar artery is patent. The superior cerebellar arteries are patent bilaterally. The P1 segments of the bilateral PCAs are patent. The P2 segment of the left PCA is patent with mild irregularity. Significantly limited evaluation of the P2 segment of the right PCA likely due to in-plane flow artifact as well as motion artifact. There is possible short segment occlusion versus severe stenosis of the right P2 segment. No aneurysm. IMPRESSION: 1. 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 2. Possible short segment occlusion versus severe stenosis of the P2 segment of the right PCA, with evaluation limited by artifact. 3. Intracranial arterial vasculature is otherwise patent. 4. Mild  chronic microvascular ischemic changes. 5. Left mastoid effusion. Electronically signed by: Donnice Mania MD 03/26/2024 11:13 PM EST RP Workstation: [id number]   MR BRAIN WO CONTRAST Result Date: 03/26/2024 EXAM: MRI BRAIN WITHOUT CONTRAST MRA HEAD WITHOUT CONTRAST 03/26/2024 06:13:20 PM TECHNIQUE: Multiplanar multisequence MRI of the brain was performed without the administration of intravenous contrast. MRA of the head was performed without contrast  using time-of-flight technique. 3D postprocessing with multiplanar reformations and MIPs was performed for better evaluation of the vasculature. COMPARISON: CT head dated 03/26/2024. CLINICAL HISTORY: Mental status change, unknown cause. FINDINGS: MRI BRAIN: BRAIN AND VENTRICLES: Multiple scattered acute infarcts. Largest confluent focus of infarct in the mesial right temporal lobe measures up to 1.1 x 0.8 cm and involves the entorhinal cortex and the parahippocampal gyrus. Additional 0.8 cm focus of acute infarct involving the left frontal corona radiata and a portion of the caudate nucleus. Smaller areas of acute infarct in the left lentiform nucleus. Multiple areas of acute infarct in the right basal ganglia. Small focus of infarct in the right temporoparietal periventricular white matter. There are a few small foci of acute infarct in the expected location of the right optic tracts. Scattered areas of T2 and FLAIR hyperintensity in the periventricular and subcortical white matter compatible with mild chronic microvascular ischemic changes. No acute intracranial hemorrhage. No mass. No midline shift. No hydrocephalus. Normal flow voids. ORBITS: No significant abnormality. SINUSES AND MASTOIDS: Left mastoid effusion. BONES AND SOFT TISSUES: Normal bone marrow signal. Degenerative changes in the visualized upper cervical spine. There is at least mild spinal canal stenosis at the C3-C4 level. MRA HEAD: ANTERIOR CIRCULATION: The intracranial internal carotid arteries  are patent bilaterally. The middle cerebral arteries are patent bilaterally. Visualized portions of the anterior cerebral arteries are patent bilaterally. No aneurysm. POSTERIOR CIRCULATION: The distal V4 segments are patent bilaterally. The left vertebral artery appears to terminate at the origin of the left PICA. Basilar artery is patent. The superior cerebellar arteries are patent bilaterally. The P1 segments of the bilateral PCAs are patent. The P2 segment of the left PCA is patent with mild irregularity. Significantly limited evaluation of the P2 segment of the right PCA likely due to in-plane flow artifact as well as motion artifact. There is possible short segment occlusion versus severe stenosis of the right P2 segment. No aneurysm. IMPRESSION: 1. 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 2. Possible short segment occlusion versus severe stenosis of the P2 segment of the right PCA, with evaluation limited by artifact. 3. Intracranial arterial vasculature is otherwise patent. 4. Mild chronic microvascular ischemic changes. 5. Left mastoid effusion. Electronically signed by: Donnice Mania MD 03/26/2024 11:13 PM EST RP Workstation: [id number]   Scheduled Meds:  aspirin  EC  81 mg Oral Q breakfast   atorvastatin   40 mg Oral Daily   clopidogrel   75 mg Oral Daily   enoxaparin  (LOVENOX ) injection  40 mg Subcutaneous Q24H   insulin  aspart  0-5 Units Subcutaneous QHS   insulin  aspart  0-9 Units Subcutaneous TID WC   insulin  glargine-yfgn  10 Units Subcutaneous QHS   pantoprazole   40 mg Oral Daily   Continuous Infusions:   LOS: 2 days   Rendall Carwin M.D on 03/28/2024 at 6:06 PM  Go to [email or web address] - for contact info  [facility] - Office  [phone]  If 7PM-7AM, please contact night-coverage [email or web address] 03/28/2024, 6:06 PM    "

## 2024-03-29 DIAGNOSIS — E1165 Type 2 diabetes mellitus with hyperglycemia: Secondary | ICD-10-CM | POA: Diagnosis not present

## 2024-03-29 DIAGNOSIS — I639 Cerebral infarction, unspecified: Secondary | ICD-10-CM | POA: Diagnosis not present

## 2024-03-29 DIAGNOSIS — Z794 Long term (current) use of insulin: Secondary | ICD-10-CM | POA: Diagnosis not present

## 2024-03-29 DIAGNOSIS — I10 Essential (primary) hypertension: Secondary | ICD-10-CM | POA: Diagnosis not present

## 2024-03-29 LAB — COMPREHENSIVE METABOLIC PANEL WITH GFR
ALT: 27 U/L (ref 0–44)
AST: 39 U/L (ref 15–41)
Albumin: 3.5 g/dL (ref 3.5–5.0)
Alkaline Phosphatase: 127 U/L — ABNORMAL HIGH (ref 38–126)
Anion gap: 10 (ref 5–15)
BUN: 7 mg/dL (ref 6–20)
CO2: 31 mmol/L (ref 22–32)
Calcium: 9 mg/dL (ref 8.9–10.3)
Chloride: 101 mmol/L (ref 98–111)
Creatinine, Ser: 0.95 mg/dL (ref 0.61–1.24)
GFR, Estimated: >60 mL/min
Glucose, Bld: 141 mg/dL — ABNORMAL HIGH (ref 70–99)
Potassium: 3 mmol/L — ABNORMAL LOW (ref 3.5–5.1)
Sodium: 141 mmol/L (ref 135–145)
Total Bilirubin: 0.5 mg/dL (ref 0.0–1.2)
Total Protein: 6.1 g/dL — ABNORMAL LOW (ref 6.5–8.1)

## 2024-03-29 LAB — GLUCOSE, CAPILLARY
Glucose-Capillary: 267 mg/dL — ABNORMAL HIGH (ref 70–99)
Glucose-Capillary: 241 mg/dL — ABNORMAL HIGH (ref 70–99)
Glucose-Capillary: 328 mg/dL — ABNORMAL HIGH (ref 70–99)
Glucose-Capillary: 262 mg/dL — ABNORMAL HIGH (ref 70–99)

## 2024-03-29 MED ORDER — POTASSIUM CHLORIDE CRYS ER 20 MEQ PO TBCR
40.0000 meq | EXTENDED_RELEASE_TABLET | ORAL | Status: AC
  Administered 2024-03-29 (×2): 40 meq via ORAL
  Filled 2024-03-29 (×2): qty 2

## 2024-03-29 NOTE — Progress Notes (Signed)
 " PROGRESS NOTE  Jordan Osborn, is a 54 y.o. male, DOB - 1970-07-04, FMW:[id number]  Admit date - 03/25/2024   Admitting Physician Posey Maier, DO  Outpatient Primary MD for the patient is Doroteo Deward BIRCH, FNP  LOS - 3  Chief Complaint  Patient presents with   Hyperglycemia      Brief Narrative:  55 y.o. male with medical history significant of obesity, diabetes mellitus who was admitted on 03/25/2024 with uncontrolled DM with metabolic acidosis and elevated anion gap required IV insulin  therapy initially - On 03/27/2023 patient had episode of unresponsiveness rapid response was called and CT head was unremarkable.  -- Patient Remains medically ready for discharge to SNF facility when SNF bed is available.      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 -Echo with EF of 55 to 60%, mild LVH, No MS, No AS,  - Recommend Aspirin  81 mg daily along with Plavix  75 mg daily for 90 days then after that STOP the Plavix   and continue ONLY Aspirin  81 mg daily indefinitely--for secondary stroke Prevention (Per The multicenter SAMMPRIS trial) -Continue atorvastatin  -- PT eval appreciated recommends SNF rehab - Patient will need 30-day heart monitor to rule out A-fib at discharge --- Patient Remains medically ready for discharge to SNF facility when SNF bed is available.   2)Acute metabolic Encephalopathy= most likely due to #1 above --- EEG with mild diffuse encephalopathy without epileptiform findings -Serum ammonia is not elevated, -Neurology consult appreciated -- Mentation is back to baseline  3)HTN--BP  improving -c/n Coreg  -Continue to hold Amlodipine  and Lisinopril   may use IV labetalol  when necessary  Every 4 hours for systolic blood pressure over 180 mmhg  4)Social/Ethics--- Called and d/w  his Wylie Riggs [phone] - Attempted to reach relatives Quillian Orlando [phone]) and  --Left voicemails. -- 5)Mild LFT Elevation--right upper quadrant ultrasound consistent with status post prior cholecystectomy without intra or extrahepatic biliary duct dilatation  -- LFTs trending down    Latest Ref Rng & Units 03/29/2024    3:35 AM 03/27/2024    4:30 AM 03/26/2024    3:35 AM  Hepatic Function  Total Protein 6.5 - 8.1 g/dL 6.1  5.8  6.2   Albumin 3.5 - 5.0 g/dL 3.5  3.4  3.6   AST 15 - 41 U/L 39  59  85   ALT 0 - 44 U/L 27  36  47   Alk Phosphatase 38 - 126 U/L 127  133  158   Total Bilirubin 0.0 - 1.2 mg/dL 0.5  0.5  0.5    6)GERD--- continue Protonix   7)DM2--A1c 13.2 consistent with uncontrolled diabetes with hyperglycemia -Bicarb and anion gap has normalized - Off IV insulin  drip --continue subcutaneous insulin  Use Novolog /Humalog Sliding scale insulin  with Accu-Cheks/Fingersticks as ordered   8)Bilateral Foot and Ankle swelling and tenderness--- negative bilateral ankle x-rays from 03/28/2024 -- Patient reports trauma at home prior to admission -- Elevated bilateral lower extremities continue physical therapy  Status is: Inpatient   Disposition: The patient is from: Home              Anticipated d/c is to: SNF  Anticipated d/c date is: 1 day              Patient currently is medically stable to d/c. Barriers:-Awaiting SNF placement for rehab-   Code Status :  -  Code Status: Full Code   Family Communication:   Unable to reach family at this time -Called and d/w  his Wylie Riggs [phone] - Attempted to reach relatives Quillian Brinks [phone]) and  --Left voicemails.  DVT Prophylaxis  :   - SCDs  enoxaparin  (LOVENOX ) injection 40 mg Start: 03/26/24  1000 SCDs Start: 03/25/24 2229   Lab Results  Component Value Date   PLT 239 03/26/2024   Inpatient Medications  Scheduled Meds:  aspirin  EC  81 mg Oral Q breakfast   atorvastatin   40 mg Oral Daily   carvedilol   6.25 mg Oral BID WC   clopidogrel   75 mg Oral Daily   enoxaparin  (LOVENOX ) injection  40 mg Subcutaneous Q24H   insulin  aspart  0-5 Units Subcutaneous QHS   insulin  aspart  0-9 Units Subcutaneous TID WC   insulin  glargine-yfgn  10 Units Subcutaneous QHS   pantoprazole   40 mg Oral Daily   potassium chloride   40 mEq Oral Q3H   Continuous Infusions:  PRN Meds:.acetaminophen  **OR** acetaminophen , dextrose , labetalol , ondansetron  **OR** ondansetron  (ZOFRAN ) IV   Anti-infectives (From admission, onward)    None       Subjective: Jordan Osborn today has no fevers, no emesis,  ,   - --No new concerns Called and d/w  his Wylie Riggs [phone] - Attempted to reach relatives [employer] [phone]) and  --Left voicemails. - -- Patient Remains medically ready for discharge to SNF facility when SNF bed is available.  - Objective: Vitals:   03/28/24 1316 03/28/24 2019 03/29/24 0500 03/29/24 0839  BP: (!) 166/96 (!) 149/74 (!) 152/83 133/84  Pulse: 83 87 81 86  Resp: 19     Temp: 98 F (36.7 C) 99.8 F (37.7 C) 98.6 F (37 C)   TempSrc: Oral Oral Tympanic   SpO2: 96% 96% 95%   Weight:      Height:        Intake/Output Summary (Last 24 hours) at 03/29/2024 1202 Last data filed at 03/29/2024 0416 Gross per 24 hour  Intake 360 ml  Output 1150 ml  Net -790 ml   Filed Weights   03/25/24 1657 03/26/24 0050  Weight: 91.2 kg 90.6 kg    Physical Exam Gen:-More awake, alert, no acute distress HEENT:- [place].AT, No sclera icterus Neck-Supple Neck,No JVD,.  Lungs-  CTAB , fair symmetrical air movement CV- S1, S2 normal, regular  Abd-  +ve B.Sounds, Abd Soft, No tenderness,    Extremity/Skin:- pedal pulses present  Psych-affect is flat, oriented x3  Neuro-more  awake, no significant focal motor deficits patient does have some sensory loss concerns but he also has peripheral neuropathy MSK--bilateral foot and ankle swelling and tenderness--- negative bilateral ankle x-rays from 03/28/2024  Data Reviewed: I have personally reviewed following labs and imaging studies  CBC: Recent Labs  Lab 03/22/24 1440 03/25/24 1728 03/26/24 0335  WBC 6.3 9.1 9.8  NEUTROABS 4.6 7.2  --   HGB 14.4 17.4* 14.5  HCT 42.6 51.9 42.3  MCV 88.0 88.3 86.7  PLT 218 260 239   Basic Metabolic Panel: Recent Labs  Lab 03/22/24 1440 03/25/24 1728 03/26/24 0335 03/27/24 0430 03/29/24 0335  NA 134* 133* 134* 134* 141  K 4.1 4.0 3.6 3.5 3.0*  CL 93* 90* 95*  98 101  CO2 23 20* 24 28 31   GLUCOSE 660* 436* 330* 336* 141*  BUN 14 20 21* 18 7  CREATININE 1.14 1.20 1.23 1.06 0.95  CALCIUM  9.6 9.5 8.7* 8.8* 9.0  MG  --   --  1.8  --   --   PHOS  --   --  2.9  --   --    GFR: Estimated Creatinine Clearance: 96.4 mL/min (by C-G formula based on SCr of 0.95 mg/dL). Liver Function Tests: Recent Labs  Lab 03/25/24 1728 03/26/24 0335 03/27/24 0430 03/29/24 0335  AST 97* 85* 59* 39  ALT 55* 47* 36 27  ALKPHOS 209* 158* 133* 127*  BILITOT 0.6 0.5 0.5 0.5  PROT 7.8 6.2* 5.8* 6.1*  ALBUMIN 4.3 3.6 3.4* 3.5   Radiology Studies: DG Ankle 2 Views Left Result Date: 03/28/2024 EXAM: 2 VIEW(S) XRAY OF THE LEFT ANKLE 03/28/2024 06:41:00 PM CLINICAL HISTORY: Ankle pain, left. COMPARISON: None available. FINDINGS: BONES AND JOINTS: No acute fracture. No malalignment. Small plantar and Achilles calcaneal spurs. SOFT TISSUES: Extensive vascular calcifications. IMPRESSION: 1. No acute findings. 2. Extensive vascular calcifications. Electronically signed by: Morgane Naveau MD 03/28/2024 06:44 PM EST RP Workstation: [id number]   DG Ankle 2 Views Right Result Date: 03/28/2024 EXAM: 2 VIEW(S) XRAY OF THE LEFT ANKLE 03/28/2024 06:41:00 PM CLINICAL HISTORY: Ankle pain, left. COMPARISON:  None available. FINDINGS: BONES AND JOINTS: Small plantar and Achilles calcaneal spurs. No acute fracture. No malalignment. SOFT TISSUES: Extensive atherosclerosis. IMPRESSION: 1. No acute findings. 2. Extensive atherosclerosis. Electronically signed by: Morgane Naveau MD 03/28/2024 06:43 PM EST RP Workstation: [id number]   Scheduled Meds:  aspirin  EC  81 mg Oral Q breakfast   atorvastatin   40 mg Oral Daily   carvedilol   6.25 mg Oral BID WC   clopidogrel   75 mg Oral Daily   enoxaparin  (LOVENOX ) injection  40 mg Subcutaneous Q24H   insulin  aspart  0-5 Units Subcutaneous QHS   insulin  aspart  0-9 Units Subcutaneous TID WC   insulin  glargine-yfgn  10 Units Subcutaneous QHS   pantoprazole   40 mg Oral Daily   potassium chloride   40 mEq Oral Q3H   Continuous Infusions:   LOS: 3 days   Rendall Carwin M.D on 03/29/2024 at 12:02 PM  Go to [email or web address] - for contact info  [facility] - Office  [phone]  If 7PM-7AM, please contact night-coverage [email or web address] 03/29/2024, 12:02 PM    "

## 2024-03-30 DIAGNOSIS — E1165 Type 2 diabetes mellitus with hyperglycemia: Secondary | ICD-10-CM | POA: Diagnosis not present

## 2024-03-30 DIAGNOSIS — R7989 Other specified abnormal findings of blood chemistry: Secondary | ICD-10-CM | POA: Diagnosis not present

## 2024-03-30 DIAGNOSIS — Z794 Long term (current) use of insulin: Secondary | ICD-10-CM | POA: Diagnosis not present

## 2024-03-30 DIAGNOSIS — I10 Essential (primary) hypertension: Secondary | ICD-10-CM | POA: Diagnosis not present

## 2024-03-30 DIAGNOSIS — I639 Cerebral infarction, unspecified: Secondary | ICD-10-CM | POA: Diagnosis not present

## 2024-03-30 LAB — GLUCOSE, CAPILLARY
Glucose-Capillary: 328 mg/dL — ABNORMAL HIGH (ref 70–99)
Glucose-Capillary: 272 mg/dL — ABNORMAL HIGH (ref 70–99)
Glucose-Capillary: 342 mg/dL — ABNORMAL HIGH (ref 70–99)
Glucose-Capillary: 133 mg/dL — ABNORMAL HIGH (ref 70–99)
Glucose-Capillary: 246 mg/dL — ABNORMAL HIGH (ref 70–99)

## 2024-03-30 MED ORDER — INSULIN GLARGINE-YFGN 100 UNIT/ML ~~LOC~~ SOPN: 15.0000 [IU] | PEN_INJECTOR | Freq: Every day | SUBCUTANEOUS | 11 refills | Status: AC

## 2024-03-30 MED ORDER — AMLODIPINE BESYLATE 5 MG PO TABS: 5.0000 mg | ORAL_TABLET | Freq: Every day | ORAL | Status: DC

## 2024-03-30 MED ORDER — CARVEDILOL 12.5 MG PO TABS: 12.5000 mg | ORAL_TABLET | Freq: Two times a day (BID) | ORAL | 11 refills | Status: AC

## 2024-03-30 MED ORDER — LISINOPRIL 10 MG PO TABS
20.0000 mg | ORAL_TABLET | Freq: Every day | ORAL | Status: DC
  Administered 2024-03-30 – 2024-03-31 (×2): 20 mg via ORAL
  Filled 2024-03-30 (×2): qty 2

## 2024-03-30 MED ORDER — CARVEDILOL 3.125 MG PO TABS: 3.1250 mg | ORAL_TABLET | Freq: Two times a day (BID) | ORAL | Status: DC

## 2024-03-30 MED ORDER — INSULIN ASPART 100 UNIT/ML IJ SOLN
0.0000 [IU] | Freq: Three times a day (TID) | INTRAMUSCULAR | Status: DC
  Administered 2024-03-30: 11 [IU] via SUBCUTANEOUS
  Administered 2024-03-30: 2 [IU] via SUBCUTANEOUS
  Administered 2024-03-31: 8 [IU] via SUBCUTANEOUS
  Administered 2024-03-31: 11 [IU] via SUBCUTANEOUS
  Filled 2024-03-30 (×3): qty 1

## 2024-03-30 MED ORDER — ATORVASTATIN CALCIUM 40 MG PO TABS: 40.0000 mg | ORAL_TABLET | Freq: Every day | ORAL | 11 refills | Status: AC

## 2024-03-30 MED ORDER — INSULIN ASPART 100 UNIT/ML IJ SOLN
0.0000 [IU] | Freq: Every day | INTRAMUSCULAR | Status: DC
  Administered 2024-03-30: 2 [IU] via SUBCUTANEOUS
  Filled 2024-03-30: qty 1

## 2024-03-30 MED ORDER — CLOPIDOGREL BISULFATE 75 MG PO TABS: 75.0000 mg | ORAL_TABLET | Freq: Every day | ORAL | 0 refills | Status: AC

## 2024-03-30 MED ORDER — ORAL CARE MOUTH RINSE: 15.0000 mL | OROMUCOSAL | Status: DC | PRN

## 2024-03-30 MED ORDER — INSULIN ASPART 100 UNIT/ML IJ SOLN
4.0000 [IU] | Freq: Three times a day (TID) | INTRAMUSCULAR | Status: DC
  Administered 2024-03-30 – 2024-03-31 (×4): 4 [IU] via SUBCUTANEOUS
  Filled 2024-03-30 (×4): qty 1

## 2024-03-30 MED ORDER — DULOXETINE HCL 60 MG PO CPEP: 60.0000 mg | ORAL_CAPSULE | Freq: Every day | ORAL | 11 refills | Status: AC

## 2024-03-30 MED ORDER — INSULIN GLARGINE-YFGN 100 UNIT/ML ~~LOC~~ SOLN
14.0000 [IU] | Freq: Every day | SUBCUTANEOUS | Status: DC
  Administered 2024-03-30: 14 [IU] via SUBCUTANEOUS
  Filled 2024-03-30 (×2): qty 0.14

## 2024-03-30 MED ORDER — GABAPENTIN 300 MG PO CAPS: 600.0000 mg | ORAL_CAPSULE | Freq: Three times a day (TID) | ORAL | 3 refills | Status: AC

## 2024-03-30 MED ORDER — PANTOPRAZOLE SODIUM 40 MG PO TBEC: 40.0000 mg | DELAYED_RELEASE_TABLET | Freq: Every day | ORAL | 11 refills | Status: AC

## 2024-03-30 MED ORDER — PANTOPRAZOLE SODIUM 40 MG PO TBEC
40.0000 mg | DELAYED_RELEASE_TABLET | Freq: Two times a day (BID) | ORAL | Status: DC
  Administered 2024-03-30: 40 mg via ORAL
  Filled 2024-03-30: qty 1

## 2024-03-30 MED ORDER — LOSARTAN POTASSIUM 50 MG PO TABS: 50.0000 mg | ORAL_TABLET | Freq: Every day | ORAL | 11 refills | Status: AC

## 2024-03-30 MED ORDER — ACETAMINOPHEN 325 MG PO TABS: 650.0000 mg | ORAL_TABLET | Freq: Four times a day (QID) | ORAL | Status: AC | PRN

## 2024-03-30 MED ORDER — GABAPENTIN 300 MG PO CAPS
600.0000 mg | ORAL_CAPSULE | Freq: Three times a day (TID) | ORAL | Status: DC
  Administered 2024-03-30 – 2024-03-31 (×4): 600 mg via ORAL
  Filled 2024-03-30: qty 2
  Filled 2024-03-30: qty 6
  Filled 2024-03-30 (×2): qty 2

## 2024-03-30 MED ORDER — INSULIN ASPART FLEXPEN 100 UNIT/ML ~~LOC~~ SOPN: 8.0000 [IU] | PEN_INJECTOR | Freq: Three times a day (TID) | SUBCUTANEOUS | 3 refills | Status: AC

## 2024-03-30 MED ORDER — ASPIRIN 81 MG PO TBEC: 81.0000 mg | DELAYED_RELEASE_TABLET | Freq: Every day | ORAL | 12 refills | Status: AC

## 2024-03-30 NOTE — Discharge Summary (Signed)
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 origin of the left PICA. Basilar artery is patent. The superior cerebellar arteries are patent bilaterally. The P1 segments of the bilateral PCAs are patent. The P2 segment of the left PCA is patent with mild irregularity. Significantly limited evaluation of the P2 segment of the right PCA likely due to in-plane flow artifact as well as motion artifact. There is possible short segment occlusion versus severe stenosis of the right P2 segment. No aneurysm. IMPRESSION: 1. 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 2. Possible short segment occlusion versus severe stenosis of the P2 segment of the right PCA, with evaluation limited by artifact. 3. Intracranial arterial vasculature is otherwise patent. 4. Mild chronic microvascular ischemic changes. 5. Left mastoid effusion. Electronically signed by: Donnice Mania MD 03/26/2024 11:13 PM EST RP Workstation: [id number]   EEG adult Result Date: 03/26/2024 Shelton Arlin KIDD, MD     03/26/2024  3:47 PM Patient Name: Jordan Osborn MRN: [id number] Epilepsy Attending: Arlin KIDD Shelton Referring  Physician/Provider: Pearlean Manus, MD Date: 03/26/2024 Duration: 23.49 mins Patient history: 54yo M with ams. EEG to evaluate for seizure Level of alertness: Awake, asleep AEDs during EEG study: None Technical aspects: This EEG study was done with scalp electrodes positioned according to the 10-20 International system of electrode placement. Electrical activity was reviewed with band pass filter of 1-70Hz , sensitivity of 7 uV/mm, display speed of 61mm/sec with a 60Hz  notched filter applied as appropriate. EEG data were recorded continuously and digitally stored.  Video monitoring was available and reviewed as appropriate. Description: The posterior dominant rhythm consists of 6 Hz activity of moderate voltage (25-35 uV) seen predominantly in posterior head regions, symmetric and reactive to eye opening and eye closing. Sleep was characterized by vertex waves, sleep spindles (12 to 14 Hz), maximal frontocentral region. There is continuous generalized 5 to 6 Hz theta slowing. Hyperventilation and photic stimulation were not performed.   ABNORMALITY - Continuous slow, generalized IMPRESSION: This study is suggestive of mild diffuse encephalopathy. No seizures or epileptiform discharges were seen throughout the recording. Priyanka KIDD Shelton   US  Abdomen Limited RUQ (LIVER/GB) Result Date: 03/26/2024 EXAM: Right Upper Quadrant Abdominal Ultrasound 03/26/2024 11:11:50 AM TECHNIQUE: Real-time ultrasonography of the right upper quadrant of the abdomen was performed. COMPARISON: 03/25/2024 CLINICAL HISTORY: Abnormal LFTs (liver function tests). FINDINGS: LIVER: Normal echogenicity. No intrahepatic biliary ductal dilatation. No evidence of mass. Hepatopetal flow in the portal vein. BILIARY SYSTEM: Cholecystectomy. No intrahepatic or extrahepatic biliary ductal dilation. Common bile duct is within normal limits measuring 4.4 mm. OTHER: No right upper quadrant ascites. IMPRESSION: 1. Cholecystectomy. No intrahepatic or  extrahepatic biliary ductal dilation. Electronically signed by: Rogelia Myers MD 03/26/2024 03:09 PM EST RP Workstation: [id number]   CT HEAD WO CONTRAST ( ) Result Date: 03/26/2024 EXAM: CT HEAD WITHOUT CONTRAST 03/26/2024 09:29:56 AM TECHNIQUE: CT of the head was performed without the administration of intravenous contrast. Automated exposure control, iterative reconstruction, and/or weight based adjustment of the  mA/kV was utilized to reduce the radiation dose to as low as reasonably achievable. COMPARISON: Head CT 10/20/2021. CLINICAL HISTORY: 54 year old male. Mental status change, unknown cause. Hospitalized with hyperglycemia. FINDINGS: BRAIN AND VENTRICLES: No acute hemorrhage. No evidence of acute infarct. 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 Punctate dystrophic calcification of the left ventral pons is chronic. Calcified atherosclerosis at the skull base. Background gray white differentiation is stable and largely normal. Probable small chronic dilated perivascular spaces in some of the superior frontal lobes subcortical white matter. No suspicious intracranial vascular hyperdensity. No hydrocephalus. No extra-axial collection. No mass effect or midline shift. ORBITS: Rightward gaze deviation. SINUSES: No acute abnormality. SOFT TISSUES AND SKULL: Trace left mastoid effusion, chronic. No acute soft tissue abnormality. No skull fracture. IMPRESSION: 1. No acute intracranial abnormality. 2. 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 Electronically signed by: Helayne Hurst MD 03/26/2024 09:39 AM EST RP Workstation: [id number]   CT ABDOMEN PELVIS WO CONTRAST Result Date: 03/25/2024 CLINICAL DATA:  Status post fall. EXAM: CT ABDOMEN AND PELVIS WITHOUT CONTRAST TECHNIQUE: Multidetector CT imaging of the abdomen and pelvis was performed following the standard protocol without IV contrast. RADIATION  DOSE REDUCTION: This exam was performed according to the departmental dose-optimization program which includes automated exposure control, adjustment of the mA and/or kV according to patient size and/or use of iterative reconstruction technique. COMPARISON:  October 25, 2022 FINDINGS: Lower chest: No acute abnormality. Hepatobiliary: No focal liver abnormality is seen. Status post cholecystectomy. No biliary dilatation. Pancreas: Unremarkable. No pancreatic ductal dilatation or surrounding inflammatory changes. Spleen: Normal in size without focal abnormality. Adrenals/Urinary Tract: Adrenal glands are unremarkable. Kidneys are normal in size, without renal calculi, focal lesion, or hydronephrosis. There is mild, stable nonspecific bilateral perinephric inflammatory fat stranding. Bladder is unremarkable. Stomach/Bowel: Stomach is within normal limits. The appendix is not identified. Surgical clips are seen within the right lower quadrant. No evidence of bowel wall thickening, distention, or inflammatory changes. Vascular/Lymphatic: Aortic atherosclerosis. No enlarged abdominal or pelvic lymph nodes. Reproductive: Prostate is unremarkable. Other: No abdominal wall hernia or abnormality. No abdominopelvic ascites. Musculoskeletal: No acute or significant osseous findings. IMPRESSION: 1. No acute or active process within the abdomen or pelvis. 2. Evidence of prior cholecystectomy. 3. Aortic atherosclerosis. Electronically Signed   By: Suzen Dials M.D.   On: 03/25/2024 19:01   DG Chest Port 1 View Result Date: 03/21/2024 EXAM: 1 VIEW(S) XRAY OF THE CHEST 03/21/2024 08:16:42 PM COMPARISON: None available. CLINICAL HISTORY: cough FINDINGS: LUNGS AND PLEURA: There is mild elevation of the left hemidiaphragm. No focal pulmonary opacity. No pleural effusion. No pneumothorax. HEART AND MEDIASTINUM: No acute abnormality of the cardiac and mediastinal silhouettes. BONES AND SOFT TISSUES: No acute osseous abnormality.  IMPRESSION: 1. No acute cardiopulmonary findings. Electronically signed by: Greig Pique MD 03/21/2024 08:21 PM EST RP Workstation: [id number]    Today   Subjective    Jordan Osborn today has no new complaints, eating and drinking well  No fever  Or chills   No Nausea, Vomiting or Diarrhea      Patient has been seen and examined prior to discharge   Objective   Blood pressure (!) 145/89, pulse 83, temperature 98.7 F (37.1 C), temperature source Oral, resp. rate 18, height 6' (1.829 m), weight 90.6 kg, SpO2 96%.   Intake/Output Summary (Last 24 hours) at 03/30/2024 1210 Last data filed at 03/30/2024 0900 Gross per 24 hour  Intake 900 ml  Output 650 ml  Net 250 ml    Exam Gen:- Awake Alert, no acute distress  HEENT:- [place].AT, No sclera icterus Neck-Supple Neck,No JVD,.  Lungs-  CTAB , good air movement bilaterally CV- S1, S2 normal, regular Abd-  +ve B.Sounds, Abd Soft, No tenderness,    Extremity/Skin:- No  edema,   good pulses Psych-affect is appropriate, oriented x3 Neuro-more awake, no significant focal motor deficits patient does have some sensory loss concerns but he also has peripheral neuropathy MSK--bilateral foot and ankle swelling and tenderness--- negative bilateral ankle x-rays from 03/28/2024   Data Review   CBC w Diff:  Lab Results  Component Value Date   WBC 9.8 03/26/2024   HGB 14.5 03/26/2024   HCT 42.3 03/26/2024   PLT 239 03/26/2024   LYMPHOPCT 11 03/25/2024   MONOPCT 8 03/25/2024   EOSPCT 0 03/25/2024   BASOPCT 0 03/25/2024   CMP:  Lab Results  Component Value Date   NA 141 03/29/2024   K 3.0 (L) 03/29/2024   CL 101 03/29/2024   CO2 31 03/29/2024   BUN 7 03/29/2024   BUN 21 03/07/2023   CREATININE 0.95 03/29/2024   GLU 468 03/07/2023   PROT 6.1 (L) 03/29/2024   ALBUMIN 3.5 03/29/2024   BILITOT 0.5 03/29/2024   ALKPHOS 127 (H) 03/29/2024   AST 39 03/29/2024   ALT 27 03/29/2024   Total Discharge time is about 33 minutes  Rendall Carwin M.D on 03/30/2024 at 12:10 PM  Go to [email or web address] -  for contact info  [facility] - Office  [phone]   "

## 2024-03-30 NOTE — Inpatient Diabetes Management (Addendum)
 Inpatient Diabetes Program Recommendations  AACE/ADA: New Consensus Statement on Inpatient Glycemic Control   Target Ranges:  Prepandial:   less than 140 mg/dL      Peak postprandial:   less than 180 mg/dL (1-2 hours)      Critically ill patients:  140 - 180 mg/dL    Latest Reference Range & Units 03/29/24 07:36 03/29/24 11:45 03/29/24 16:43 03/29/24 20:29 03/30/24 07:17  Glucose-Capillary 70 - 99 mg/dL 732 (H) 758 (H) 671 (H) 262 (H) 272 (H)   Review of Glycemic Control  Diabetes history: DM2 Outpatient Diabetes medications: Tresiba  8-15 units daily, Novolog  8 units TID with meals Current orders for Inpatient glycemic control: Semglee  10 units at bedtime, Novolog  0-9 units TID with meals, Novolog  0-5 units at bedtime  Inpatient Diabetes Program Recommendations:    Insulin : Please consider increasing Semglee  to 15 units at bedtime and ordering Novolog  5 units TID with meals for meal coverage if patient eats at least 50% of meals.  Addendum 03/30/24@11 :20-Diabetes coordinator working remotely.  Attempted to call patient's room several times to discuss DM control and A1C of 13.2% but no answer.   Thanks, Earnie Gainer, RN, MSN, CDCES Diabetes Coordinator Inpatient Diabetes Program [phone] (Team Pager from 8am to 5pm)
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
## 2024-03-30 NOTE — Plan of Care (Signed)

## 2024-03-30 NOTE — Plan of Care (Signed)
" °  Problem: Education: Goal: Ability to describe self-care measures that may prevent or decrease complications (Diabetes Survival Skills Education) will improve Outcome: Adequate for Discharge Goal: Individualized Educational Video(s) Outcome: Adequate for Discharge   Problem: Coping: Goal: Ability to adjust to condition or change in health will improve Outcome: Adequate for Discharge   Problem: Fluid Volume: Goal: Ability to maintain a balanced intake and output will improve Outcome: Adequate for Discharge   Problem: Health Behavior/Discharge Planning: Goal: Ability to identify and utilize available resources and services will improve Outcome: Adequate for Discharge Goal: Ability to manage health-related needs will improve Outcome: Adequate for Discharge   Problem: Metabolic: Goal: Ability to maintain appropriate glucose levels will improve Outcome: Adequate for Discharge   Problem: Nutritional: Goal: Maintenance of adequate nutrition will improve Outcome: Adequate for Discharge Goal: Progress toward achieving an optimal weight will improve Outcome: Adequate for Discharge   Problem: Skin Integrity: Goal: Risk for impaired skin integrity will decrease Outcome: Adequate for Discharge   Problem: Tissue Perfusion: Goal: Adequacy of tissue perfusion will improve Outcome: Adequate for Discharge   Problem: Education: Goal: Knowledge of General Education information will improve Description: Including pain rating scale, medication(s)/side effects and non-pharmacologic comfort measures Outcome: Adequate for Discharge   Problem: Health Behavior/Discharge Planning: Goal: Ability to manage health-related needs will improve Outcome: Adequate for Discharge   Problem: Clinical Measurements: Goal: Ability to maintain clinical measurements within normal limits will improve Outcome: Adequate for Discharge Goal: Will remain free from infection Outcome: Adequate for Discharge Goal:  Diagnostic test results will improve Outcome: Adequate for Discharge Goal: Respiratory complications will improve Outcome: Adequate for Discharge Goal: Cardiovascular complication will be avoided Outcome: Adequate for Discharge   Problem: Activity: Goal: Risk for activity intolerance will decrease Outcome: Adequate for Discharge   Problem: Nutrition: Goal: Adequate nutrition will be maintained Outcome: Adequate for Discharge   Problem: Coping: Goal: Level of anxiety will decrease Outcome: Adequate for Discharge   Problem: Elimination: Goal: Will not experience complications related to bowel motility Outcome: Adequate for Discharge Goal: Will not experience complications related to urinary retention Outcome: Adequate for Discharge   Problem: Pain Managment: Goal: General experience of comfort will improve and/or be controlled Outcome: Adequate for Discharge   Problem: Safety: Goal: Ability to remain free from injury will improve Outcome: Adequate for Discharge   Problem: Skin Integrity: Goal: Risk for impaired skin integrity will decrease Outcome: Adequate for Discharge   Problem: Acute Rehab OT Goals (only OT should resolve) Goal: Pt. Will Perform Eating Outcome: Adequate for Discharge Goal: Pt. Will Perform Grooming Outcome: Adequate for Discharge Goal: Pt. Will Perform Upper Body Dressing Outcome: Adequate for Discharge Goal: Pt. Will Perform Lower Body Dressing Outcome: Adequate for Discharge Goal: Pt. Will Transfer To Toilet Outcome: Adequate for Discharge Goal: Pt. Will Perform Toileting-Clothing Manipulation Outcome: Adequate for Discharge Goal: Pt. Will Perform Tub/Shower Transfer Outcome: Adequate for Discharge   Problem: Acute Rehab OT Goals (only OT should resolve) Goal: Pt/Caregiver Will Perform Home Exercise Program Outcome: Adequate for Discharge   Problem: Acute Rehab PT Goals(only PT should resolve) Goal: Pt Will Go Supine/Side To Sit Outcome:  Adequate for Discharge Goal: Patient Will Transfer Sit To/From Stand Outcome: Adequate for Discharge Goal: Pt Will Transfer Bed To Chair/Chair To Bed Outcome: Adequate for Discharge Goal: Pt Will Ambulate Outcome: Adequate for Discharge   "
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
## 2024-03-31 ENCOUNTER — Telehealth: Payer: Self-pay

## 2024-03-31 DIAGNOSIS — I639 Cerebral infarction, unspecified: Secondary | ICD-10-CM

## 2024-03-31 LAB — GLUCOSE, CAPILLARY
Glucose-Capillary: 303 mg/dL — ABNORMAL HIGH (ref 70–99)
Glucose-Capillary: 286 mg/dL — ABNORMAL HIGH (ref 70–99)

## 2024-03-31 NOTE — Telephone Encounter (Signed)
-----   Message from Rendall Carwin, MD sent at 03/28/2024  5:59 PM EST ----- Regarding: 30-day Heart monitor - Patient will need 30-day Heart monitor at discharge due to stroke  - Thank U  Rendall Carwin, MD

## 2024-03-31 NOTE — Inpatient Diabetes Management (Signed)
 Inpatient Diabetes Program Recommendations  AACE/ADA: New Consensus Statement on Inpatient Glycemic Control   Target Ranges:  Prepandial:   less than 140 mg/dL      Peak postprandial:   less than 180 mg/dL (1-2 hours)      Critically ill patients:  140 - 180 mg/dL    Latest Reference Range & Units 03/30/24 07:17 03/30/24 11:34 03/30/24 16:40 03/30/24 21:25 03/31/24 08:19  Glucose-Capillary 70 - 99 mg/dL 727 (H) 657 (H) 866 (H) 246 (H) 303 (H)   Review of Glycemic Control  Diabetes history: DM2 Outpatient Diabetes medications: Tresiba  8-15 units daily, Novolog  8 units TID with meals Current orders for Inpatient glycemic control: Semglee  14 units at bedtime, Novolog  0-9 units TID with meals, Novolog  0-5 units at bedtime, Novolog  4 units TID with meals   Inpatient Diabetes Program Recommendations:     Insulin : Please consider increasing Semglee  to 16 units at bedtime and ordering Novolog  6 units TID with meals for meal coverage if patient eats at least 50% of meals.   Thanks, Earnie Gainer, RN, MSN, CDCES Diabetes Coordinator Inpatient Diabetes Program [phone] (Team Pager from 8am to 5pm)

## 2024-03-31 NOTE — Telephone Encounter (Signed)
 30 day cardiac monitor order placed.

## 2024-03-31 NOTE — Progress Notes (Signed)
 [facility] here to transport patient to [facility]. Spoke with Gwendolyn Graves and updated her on patient status and plan of care.

## 2024-03-31 NOTE — Progress Notes (Signed)
 Report called to [facility]. All questions answered. PIV and telemetry removed. Patient awaiting for transport at this time.
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
## 2024-03-31 NOTE — TOC Transition Note (Signed)
 Transition of Care [facility]) - Discharge Note   Patient Details  Name: Jordan Osborn MRN: [id number] Date of Birth: Jul 01, 1970  [facility] CM/SW Contact:  Mcarthur Saddie Kim, LCSW Phone Number: 03/31/2024, 10:43 AM   Clinical Narrative: Pt d/c today to SNF. LCSW spoke with pt and pt's niece and they request to change SNF to [facility]. [facility] updated. [facility] can accept today. D/C summary sent to SNF. Will transport by [facility]. RN given number to call report.       Final next level of care: Skilled Nursing Facility Barriers to Discharge: Barriers Resolved   Patient Goals and CMS Choice Patient states their goals for this hospitalization and ongoing recovery are:: SNF for STR CMS Medicare.gov Compare Post Acute Care list provided to:: Patient Represenative (must comment) (Niece - Tiffany) Choice offered to / list presented to : [facility] POA / Guardian      Discharge Placement                Patient to be transferred to facility by: [facility] Name of family member notified: Niece - Tiffany Patient and family notified of of transfer: 03/31/24  Discharge Plan and Services Additional resources added to the After Visit Summary for                  DME Arranged: N/A DME Agency: NA                  Social Drivers of Health (SDOH) Interventions SDOH Screenings   Food Insecurity: Food Insecurity Present (03/26/2024)  Housing: High Risk (03/26/2024)  Transportation Needs: Unmet Transportation Needs (03/26/2024)  Utilities: At Risk (03/26/2024)  Financial Resource Strain: Low Risk  (07/09/2023)   Received from [facility]  Tobacco Use: Low Risk (03/25/2024)     Readmission Risk Interventions    03/30/2024   11:54 AM  Readmission Risk Prevention Plan  Medication Screening Complete  Transportation Screening Complete

## 2024-04-05 ENCOUNTER — Encounter (HOSPITAL_COMMUNITY): Payer: Self-pay | Admitting: Emergency Medicine

## 2024-04-05 ENCOUNTER — Emergency Department (HOSPITAL_COMMUNITY)

## 2024-04-05 ENCOUNTER — Other Ambulatory Visit: Payer: Self-pay

## 2024-04-05 ENCOUNTER — Emergency Department (HOSPITAL_COMMUNITY)
Admission: EM | Admit: 2024-04-05 | Discharge: 2024-04-06 | Disposition: A | Discharge status: Skilled Nursing Facility | Attending: Emergency Medicine | Admitting: Emergency Medicine

## 2024-04-05 DIAGNOSIS — R0602 Shortness of breath: Secondary | ICD-10-CM | POA: Insufficient documentation

## 2024-04-05 DIAGNOSIS — R079 Chest pain, unspecified: Secondary | ICD-10-CM | POA: Insufficient documentation

## 2024-04-05 DIAGNOSIS — M79672 Pain in left foot: Secondary | ICD-10-CM | POA: Insufficient documentation

## 2024-04-05 DIAGNOSIS — Z7982 Long term (current) use of aspirin: Secondary | ICD-10-CM | POA: Insufficient documentation

## 2024-04-05 DIAGNOSIS — Z794 Long term (current) use of insulin: Secondary | ICD-10-CM | POA: Insufficient documentation

## 2024-04-05 DIAGNOSIS — R739 Hyperglycemia, unspecified: Secondary | ICD-10-CM | POA: Insufficient documentation

## 2024-04-05 DIAGNOSIS — M79671 Pain in right foot: Secondary | ICD-10-CM | POA: Insufficient documentation

## 2024-04-05 LAB — BLOOD GAS, VENOUS
Acid-Base Excess: 8.4 mmol/L — ABNORMAL HIGH (ref 0.0–2.0)
Bicarbonate: 34.9 mmol/L — ABNORMAL HIGH (ref 20.0–28.0)
O2 Saturation: 65.9 %
Patient temperature: 37
pCO2, Ven: 55 mmHg (ref 44–60)
pH, Ven: 7.41 (ref 7.25–7.43)
pO2, Ven: 36 mmHg (ref 32–45)

## 2024-04-05 LAB — URINALYSIS, ROUTINE W REFLEX MICROSCOPIC
Bilirubin Urine: NEGATIVE
Glucose, UA: >=500 mg/dL — AB
Hgb urine dipstick: NEGATIVE
Ketones, ur: NEGATIVE mg/dL
Nitrite: NEGATIVE
Protein, ur: 30 mg/dL — AB
Specific Gravity, Urine: 1.028 (ref 1.005–1.030)
pH: 5 (ref 5.0–8.0)

## 2024-04-05 LAB — BASIC METABOLIC PANEL WITH GFR
Anion gap: 11 (ref 5–15)
BUN: 19 mg/dL (ref 6–20)
CO2: 30 mmol/L (ref 22–32)
Calcium: 9.4 mg/dL (ref 8.9–10.3)
Chloride: 98 mmol/L (ref 98–111)
Creatinine, Ser: 1.05 mg/dL (ref 0.61–1.24)
GFR, Estimated: >60 mL/min
Glucose, Bld: 356 mg/dL — ABNORMAL HIGH (ref 70–99)
Potassium: 4.5 mmol/L (ref 3.5–5.1)
Sodium: 139 mmol/L (ref 135–145)

## 2024-04-05 LAB — CBC
HCT: 46.5 % (ref 39.0–52.0)
Hemoglobin: 15.3 g/dL (ref 13.0–17.0)
MCH: 29.4 pg (ref 26.0–34.0)
MCHC: 32.9 g/dL (ref 30.0–36.0)
MCV: 89.4 fL (ref 80.0–100.0)
Platelets: 320 10*3/uL (ref 150–400)
RBC: 5.2 MIL/uL (ref 4.22–5.81)
RDW: 12.6 % (ref 11.5–15.5)
WBC: 6.4 10*3/uL (ref 4.0–10.5)
nRBC: 0 % (ref 0.0–0.2)

## 2024-04-05 LAB — D-DIMER, QUANTITATIVE: D-Dimer, Quant: 0.29 ug{FEU}/mL (ref 0.00–0.50)

## 2024-04-05 LAB — TROPONIN T, HIGH SENSITIVITY
Troponin T High Sensitivity: 23 ng/L — ABNORMAL HIGH (ref 0–19)
Troponin T High Sensitivity: 24 ng/L — ABNORMAL HIGH (ref 0–19)

## 2024-04-05 MED ORDER — INSULIN ASPART 100 UNIT/ML IJ SOLN
6.0000 [IU] | Freq: Once | INTRAMUSCULAR | Status: AC
  Administered 2024-04-05: 6 [IU] via SUBCUTANEOUS
  Filled 2024-04-05: qty 1

## 2024-04-05 NOTE — ED Provider Notes (Signed)
 " [facility] EM[facility]GENC[facility] DEPARTMENT AT [facility] Provider Note   CSN: [id number] Arrival date & time: 04/05/24  2036     Patient presents with: Hyperglycemia   Benz C Coiner is a 54 y.o. male.  He is brought in by ambulance.  He said he had 2 episodes each lasting about an hour of right upper chest pain.  He said it was sharp.  He thinks it is related to his heart.  It is gone now.  He has no history of heart disease.  His blood sugar was also elevated.  EMS gave him aspirin  and nitroglycerin prior to transport from nursing facility.  He was recently admitted for elevated blood sugars and found to have a stroke.  Started on Plavix .  Sent to rehab.  {Add pertinent medical, surgical, social history, OB history to YEP:67052} The history is provided by the patient and the EMS personnel.  Chest Pain Pain location:  R chest Pain quality: stabbing   Pain severity:  Moderate Onset quality:  Sudden Duration:  1 day Timing:  Sporadic Progression:  Resolved Chronicity:  New Relieved by:  Nothing Associated symptoms: shortness of breath   Associated symptoms: no abdominal pain, no cough, no diaphoresis, no fever, no nausea and no vomiting   Risk factors: no smoking        Prior to Admission medications  Medication Sig Start Date End Date Taking? Authorizing Provider  acetaminophen  (TYLENOL ) 325 MG tablet Take 2 tablets (650 mg total) by mouth every 6 (six) hours as needed for mild pain (pain score 1-3) or fever (or Fever >/= 101). 03/30/24   Pearlean Manus, MD  aspirin  EC 81 MG tablet Take 1 tablet (81 mg total) by mouth daily with breakfast. Swallow whole. 03/31/24   Pearlean Manus, MD  atorvastatin  (LIPITOR) 40 MG tablet Take 1 tablet (40 mg total) by mouth daily. 03/30/24   Pearlean Manus, MD  carvedilol  (COREG ) 12.5 MG tablet Take 1 tablet (12.5 mg total) by mouth 2 (two) times daily. 03/30/24 03/30/25  Pearlean Manus, MD  clopidogrel  (PLAVIX ) 75 MG tablet Take 1 tablet (75 mg  total) by mouth daily. Take Aspirin  81 mg daily along with Plavix  75 mg daily for 21 days then after that STOP the Plavix   and continue ONLY Aspirin  81 mg daily indefinitely 03/31/24   Pearlean Manus, MD  clotrimazole  (LOTRIMIN ) 1 % cream Apply to affected area 2 times daily 03/21/24   Towana Ozell BROCKS, MD  DULoxetine  (CYMBALTA ) 60 MG capsule Take 1 capsule (60 mg total) by mouth daily. 03/30/24   Pearlean Manus, MD  gabapentin  (NEURONTIN ) 300 MG capsule Take 2 capsules (600 mg total) by mouth 3 (three) times daily. 03/30/24   Pearlean Manus, MD  glucose monitoring kit (FREESTYLE) monitoring kit 1 each by Does not apply route as needed for other. 03/21/24   Towana Ozell BROCKS, MD  hydrocortisone  cream 1 % Apply to affected area 2 times daily 03/21/24   Towana Ozell BROCKS, MD  Insulin  Aspart FlexPen (NOVOLOG ) 100 UNIT/ML Inject 8 Units into the skin 3 (three) times daily with meals. 03/30/24   Pearlean Manus, MD  insulin  glargine-yfgn (SEMGLEE ) 100 UNIT/ML Pen Inject 15 Units into the skin at bedtime. 03/30/24   Pearlean Manus, MD  losartan  (COZAAR ) 50 MG tablet Take 1 tablet (50 mg total) by mouth daily. 03/30/24   Pearlean Manus, MD  nitroGLYCERIN (NITROSTAT) 0.4 MG SL tablet Place 0.4 mg under the tongue every 5 (five) minutes as needed for  chest pain. 10/19/20   [provider]  pantoprazole  (PROTONIX ) 40 MG tablet Take 1 tablet (40 mg total) by mouth daily. 03/30/24   Pearlean Manus, MD  povidone-iodine  (BETADINE ) 10 % external solution Apply 1 Application topically as needed for wound care. 03/21/24   Towana Ozell BROCKS, MD    Allergies: Iodine , Benadryl [diphenhydramine hcl], Morphine  and codeine, and Omnipaque  [iohexol ]    Review of Systems  Constitutional:  Negative for diaphoresis and fever.  Respiratory:  Positive for shortness of breath. Negative for cough.   Cardiovascular:  Positive for chest pain.  Gastrointestinal:  Negative for abdominal pain, nausea and vomiting.     Updated Vital Signs BP 130/81   Pulse 87   Temp 98.6 F (37 C) (Oral)   Resp (!) 25   Ht 6' (1.829 m)   Wt 91 kg   SpO2 98%   BMI 27.21 kg/m   Physical Exam Vitals and nursing note reviewed.  Constitutional:      General: He is not in acute distress.    Appearance: Normal appearance. He is well-developed.  HENT:     Head: Normocephalic and atraumatic.  Eyes:     Conjunctiva/sclera: Conjunctivae normal.  Cardiovascular:     Rate and Rhythm: Normal rate and regular rhythm.     Heart sounds: No murmur heard. Pulmonary:     Effort: Pulmonary effort is normal. No respiratory distress.     Breath sounds: Normal breath sounds.  Abdominal:     Palpations: Abdomen is soft.     Tenderness: There is no abdominal tenderness. There is no guarding or rebound.  Musculoskeletal:        General: No deformity.     Cervical back: Neck supple.  Skin:    General: Skin is warm and dry.     Capillary Refill: Capillary refill takes less than 2 seconds.  Neurological:     General: No focal deficit present.     Mental Status: He is alert.     (all labs ordered are listed, but only abnormal results are displayed) Labs Reviewed  CBC  URINALYSIS, ROUTINE W REFLEX MICROSCOPIC  BASIC METABOLIC PANEL WITH GFR  BLOOD GAS, VENOUS  CBG MONITORING, ED  TROPONIN T, HIGH SENSITIVITY    EKG: None  Radiology: No results found.  {Document cardiac monitor, telemetry assessment procedure when appropriate:32947} Procedures   Medications Ordered in the ED - No data to display    {Click here for ABCD2, HEART and other calculators REFRESH Note before signing:1}                              Medical Decision Making Amount and/or Complexity of Data Reviewed Labs: ordered. Radiology: ordered.   This patient complains of ***; this involves an extensive number of treatment Options and is a complaint that carries with it a high risk of complications and morbidity. The differential includes  ***  I ordered, reviewed and interpreted labs, which included *** I ordered medication *** and reviewed PMP when indicated. I ordered imaging studies which included *** and I independently    visualized and interpreted imaging which showed *** Additional history obtained from *** Previous records obtained and reviewed *** I consulted *** and discussed lab and imaging findings and discussed disposition.  Cardiac monitoring reviewed, *** Social determinants considered, *** Critical Interventions: ***  After the interventions stated above, I reevaluated the patient and found *** Admission and further testing  considered, ***   {Document critical care time when appropriate  Document review of labs and clinical decision tools ie CHADS2VASC2, etc  Document your independent review of radiology images and any outside records  Document your discussion with family members, caretakers and with consultants  Document social determinants of health affecting pt's care  Document your decision making why or why not admission, treatments were needed:32947:::1}   Final diagnoses:  None    ED Discharge Orders     None        "

## 2024-04-05 NOTE — Discharge Instructions (Addendum)
 You were seen in the emergency department for right-sided chest pain and elevated blood sugar.  You had lab work chest x-ray urinalysis that did not show any evidence of heart attack or blood clot.  Please continue your regular medications and follow your blood sugars.  Follow-up with your primary care doctor.  Return if any worsening or concerning symptoms.

## 2024-04-05 NOTE — ED Notes (Signed)
 Pt given sandwich  and water per request- pt a/w ride back to [facility]

## 2024-04-05 NOTE — ED Triage Notes (Addendum)
 Pt bib EMS for c/o intermittent R sided chest pain that he describes as sharp. EMS reports hyperglycemia with CBG reading 451. EMS states pt was given 324 ASA and 2 SL NTG tabs by nursing home prior to EMS arrival.

## 2024-04-05 NOTE — ED Triage Notes (Signed)
 Pt from [facility]

## 2024-04-06 LAB — CBG MONITORING, ED
Glucose-Capillary: 338 mg/dL — ABNORMAL HIGH (ref 70–99)
Glucose-Capillary: 369 mg/dL — ABNORMAL HIGH (ref 70–99)

## 2024-04-06 MED ORDER — GABAPENTIN 300 MG PO CAPS
600.0000 mg | ORAL_CAPSULE | Freq: Three times a day (TID) | ORAL | Status: DC
  Administered 2024-04-06: 600 mg via ORAL
  Filled 2024-04-06: qty 2

## 2024-04-06 MED ORDER — ASPIRIN 81 MG PO TBEC
81.0000 mg | DELAYED_RELEASE_TABLET | Freq: Every day | ORAL | Status: DC
  Administered 2024-04-06: 81 mg via ORAL
  Filled 2024-04-06: qty 1

## 2024-04-06 MED ORDER — PANTOPRAZOLE SODIUM 40 MG PO TBEC
40.0000 mg | DELAYED_RELEASE_TABLET | Freq: Every day | ORAL | Status: DC
  Administered 2024-04-06: 40 mg via ORAL
  Filled 2024-04-06: qty 1

## 2024-04-06 MED ORDER — CLOPIDOGREL BISULFATE 75 MG PO TABS
75.0000 mg | ORAL_TABLET | Freq: Every day | ORAL | Status: DC
  Administered 2024-04-06: 75 mg via ORAL
  Filled 2024-04-06: qty 1

## 2024-04-06 MED ORDER — CARVEDILOL 12.5 MG PO TABS
12.5000 mg | ORAL_TABLET | Freq: Two times a day (BID) | ORAL | Status: DC
  Administered 2024-04-06: 12.5 mg via ORAL
  Filled 2024-04-06: qty 1

## 2024-04-06 MED ORDER — DULOXETINE HCL 30 MG PO CPEP
60.0000 mg | ORAL_CAPSULE | Freq: Every day | ORAL | Status: DC
  Administered 2024-04-06: 60 mg via ORAL
  Filled 2024-04-06: qty 2

## 2024-04-06 MED ORDER — ATORVASTATIN CALCIUM 40 MG PO TABS
40.0000 mg | ORAL_TABLET | Freq: Every day | ORAL | Status: DC
  Administered 2024-04-06: 40 mg via ORAL
  Filled 2024-04-06: qty 1

## 2024-04-06 MED ORDER — ACETAMINOPHEN 325 MG PO TABS: 650.0000 mg | ORAL_TABLET | Freq: Four times a day (QID) | ORAL | Status: DC | PRN

## 2024-04-06 NOTE — ED Notes (Signed)
 Pt given another cup of water per request.
